# Patient Record
Sex: Female | Born: 1980 | Race: White | Hispanic: No | Marital: Married | Smoking: Never smoker
Health system: Southern US, Community
[De-identification: ages and names within clinical notes are randomized; demographics above are authoritative.]

## PROBLEM LIST (undated history)

## (undated) DIAGNOSIS — T753XXA Motion sickness, initial encounter: Secondary | ICD-10-CM

## (undated) DIAGNOSIS — T7840XA Allergy, unspecified, initial encounter: Secondary | ICD-10-CM

## (undated) DIAGNOSIS — Z8489 Family history of other specified conditions: Secondary | ICD-10-CM

## (undated) DIAGNOSIS — C44311 Basal cell carcinoma of skin of nose: Secondary | ICD-10-CM

## (undated) DIAGNOSIS — C44319 Basal cell carcinoma of skin of other parts of face: Secondary | ICD-10-CM

## (undated) HISTORY — PX: LEEP: SHX91

## (undated) HISTORY — DX: Basal cell carcinoma of skin of other parts of face: C44.319

## (undated) HISTORY — DX: Basal cell carcinoma of skin of nose: C44.311

## (undated) HISTORY — DX: Allergy, unspecified, initial encounter: T78.40XA

## (undated) HISTORY — PX: WISDOM TOOTH EXTRACTION: SHX21

---

## 2004-12-22 ENCOUNTER — Inpatient Hospital Stay: Payer: Self-pay | Admitting: Obstetrics and Gynecology

## 2007-03-06 ENCOUNTER — Emergency Department: Payer: Self-pay | Admitting: Emergency Medicine

## 2008-06-16 ENCOUNTER — Ambulatory Visit: Payer: Self-pay | Admitting: Internal Medicine

## 2010-06-21 ENCOUNTER — Ambulatory Visit: Payer: Self-pay | Admitting: Internal Medicine

## 2013-05-17 DIAGNOSIS — Z85828 Personal history of other malignant neoplasm of skin: Secondary | ICD-10-CM

## 2013-05-17 HISTORY — DX: Personal history of other malignant neoplasm of skin: Z85.828

## 2014-01-16 DIAGNOSIS — Z86018 Personal history of other benign neoplasm: Secondary | ICD-10-CM

## 2014-01-16 HISTORY — DX: Personal history of other benign neoplasm: Z86.018

## 2016-01-15 ENCOUNTER — Other Ambulatory Visit: Payer: Self-pay | Admitting: Obstetrics and Gynecology

## 2016-01-15 DIAGNOSIS — Z1231 Encounter for screening mammogram for malignant neoplasm of breast: Secondary | ICD-10-CM

## 2016-02-12 ENCOUNTER — Ambulatory Visit: Payer: Self-pay

## 2016-02-14 ENCOUNTER — Ambulatory Visit
Admission: RE | Admit: 2016-02-14 | Discharge: 2016-02-14 | Disposition: A | Discharge status: Home / Self Care | Payer: Managed Care, Other (non HMO) | Source: Ambulatory Visit | Attending: Obstetrics and Gynecology | Admitting: Obstetrics and Gynecology

## 2016-02-14 ENCOUNTER — Encounter (HOSPITAL_COMMUNITY): Payer: Self-pay

## 2016-02-14 DIAGNOSIS — Z1231 Encounter for screening mammogram for malignant neoplasm of breast: Secondary | ICD-10-CM | POA: Diagnosis not present

## 2016-02-22 ENCOUNTER — Encounter: Payer: Self-pay | Admitting: *Deleted

## 2016-02-22 NOTE — Discharge Instructions (Signed)
[facility] °[facility] °ENDOSCOPIC SINUS SURGERY °[facility] ° °What is Functional Endoscopic Sinus Surgery? ° The Surgery involves making the natural openings of the sinuses larger by removing the bony partitions that separate the sinuses from the nasal cavity.  The natural sinus lining is preserved as much as possible to allow the sinuses to resume normal function after the surgery.  In some patients nasal polyps (excessively swollen lining of the sinuses) may be removed to relieve obstruction of the sinus openings.  The surgery is performed through the nose using lighted scopes, which eliminates the need for incisions on the face.  A septoplasty is a different procedure which is sometimes performed with sinus surgery.  It involves straightening the boy partition that separates the two sides of your nose.  A crooked or deviated septum may need repair if is obstructing the sinuses or nasal airflow.  Turbinate reduction is also often performed during sinus surgery.  The turbinates are bony proturberances from the side walls of the nose which swell and can obstruct the nose in patients with sinus and allergy problems.  Their size can be surgically reduced to help relieve nasal obstruction. ° °What Can Sinus Surgery Do For Me? ° Sinus surgery can reduce the frequency of sinus infections requiring antibiotic treatment.  This can provide improvement in nasal congestion, post-nasal drainage, facial pressure and nasal obstruction.  Surgery will NOT prevent you from ever having an infection again, so it usually only for patients who get infections 4 or more times yearly requiring antibiotics, or for infections that do not clear with antibiotics.  It will not cure nasal allergies, so patients with allergies may still require medication to treat their allergies after surgery. Surgery may improve headaches related to sinusitis, however, some people will continue to  require medication to control sinus headaches related to allergies.  Surgery will do nothing for other forms of headache (migraine, tension or cluster). ° °What Are the Risks of Endoscopic Sinus Surgery? ° Current techniques allow surgery to be performed safely with little risk, however, there are rare complications that patients should be aware of.  Because the sinuses are located around the eyes, there is risk of eye injury, including blindness, though again, this would be quite rare. This is usually a result of bleeding behind the eye during surgery, which puts the vision oat risk, though there are treatments to protect the vision and prevent permanent disrupted by surgery causing a leak of the spinal fluid that surrounds the brain.  More serious complications would include bleeding inside the brain cavity or damage to the brain.  Again, all of these complications are uncommon, and spinal fluid leaks can be safely managed surgically if they occur.  The most common complication of sinus surgery is bleeding from the nose, which may require packing or cauterization of the nose.  Continued sinus have polyps may experience recurrence of the polyps requiring revision surgery.  Alterations of sense of smell or injury to the tear ducts are also rare complications.  ° °What is the Surgery Like, and what is the Recovery? ° The Surgery usually takes a couple of hours to perform, and is usually performed under a general anesthetic (completely asleep).  Patients are usually discharged home after a couple of hours.  Sometimes during surgery it is necessary to pack the nose to control bleeding, and the packing is left in place for 24 - 48 hours, and removed by your surgeon.    If a septoplasty was performed during the procedure, there is often a splint placed which must be removed after 5-7 days.   °Discomfort: Pain is usually mild to moderate, and can be controlled by prescription pain medication or acetaminophen (Tylenol).   Aspirin, Ibuprofen (Advil, Motrin), or Naprosyn (Aleve) should be avoided, as they can cause increased bleeding.  Most patients feel sinus pressure like they have a bad head cold for several days.  Sleeping with your head elevated can help reduce swelling and facial pressure, as can ice packs over the face.  A humidifier may be helpful to keep the mucous and blood from drying in the nose.  ° °Diet: There are no specific diet restrictions, however, you should generally start with clear liquids and a light diet of bland foods because the anesthetic can cause some nausea.  Advance your diet depending on how your stomach feels.  Taking your pain medication with food will often help reduce stomach upset which pain medications can cause. ° °Nasal Saline Irrigation: It is important to remove blood clots and dried mucous from the nose as it is healing.  This is done by having you irrigate the nose at least 3 - 4 times daily with a salt water solution.  We recommend using NeilMed Sinus Rinse (available at the drug store).  Fill the squeeze bottle with the solution, bend over a sink, and insert the tip of the squeeze bottle into the nose ½ of an inch.  Point the tip of the squeeze bottle towards the inside corner of the eye on the same side your irrigating.  Squeeze the bottle and gently irrigate the nose.  If you bend forward as you do this, most of the fluid will flow back out of the nose, instead of down your throat.   The solution should be warm, near body temperature, when you irrigate.   Each time you irrigate, you should use a full squeeze bottle.  ° °Note that if you are instructed to use Nasal Steroid Sprays at any time after your surgery, irrigate with saline BEFORE using the steroid spray, so you do not wash it all out of the nose. °Another product, Nasal Saline Gel (such as AYR Nasal Saline Gel) can be applied in each nostril 3 - 4 times daily to moisture the nose and reduce scabbing or crusting. ° °Bleeding:   Bloody drainage from the nose can be expected for several days, and patients are instructed to irrigate their nose frequently with salt water to help remove mucous and blood clots.  The drainage may be dark red or brown, though some fresh blood may be seen intermittently, especially after irrigation.  Do not blow you nose, as bleeding may occur. If you must sneeze, keep your mouth open to allow air to escape through your mouth. ° °If heavy bleeding occurs: Irrigate the nose with saline to rinse out clots, then spray the nose 3 - 4 times with Afrin Nasal Decongestant Spray.  The spray will constrict the blood vessels to slow bleeding.  Pinch the lower half of your nose shut to apply pressure, and lay down with your head elevated.  Ice packs over the nose may help as well. If bleeding persists despite these measures, you should notify your doctor.  Do not use the Afrin routinely to control nasal congestion after surgery, as it can result in worsening congestion and may affect healing.  ° °Activity: Return to work varies among patients. Most patients will be out of   work at least 5 - 7 days to recover.  Patient may return to work after they are off of narcotic pain medication, and feeling well enough to perform the functions of their job.  Patients must avoid heavy lifting (over 10 pounds) or strenuous physical for 2 weeks after surgery, so your employer may need to assign you to light duty, or keep you out of work longer if light duty is not possible.  NOTE: you should not drive, operate dangerous machinery, do any mentally demanding tasks or make any important legal or financial decisions while on narcotic pain medication and recovering from the general anesthetic.  °  °Call Your Doctor Immediately if You Have Any of the Following: °1. Bleeding that you cannot control with the above measures °2. Loss of vision, double vision, bulging of the eye or black eyes. °3. Fever over 101 degrees °4. Neck stiffness with severe  headache, fever, nausea and change in mental state. °You are always encourage to call anytime with concerns, however, please call with requests for pain medication refills during office hours. ° °Office Endoscopy: During follow-up visits your doctor will remove any packing or splints that may have been placed and evaluate and clean your sinuses endoscopically.  Topical anesthetic will be used to make this as comfortable as possible, though you may want to take your pain medication prior to the visit.  How often this will need to be done varies from patient to patient.  After complete recovery from the surgery, you may need follow-up endoscopy from time to time, particularly if there is concern of recurrent infection or nasal polyps. ° ° °General Anesthesia, Adult, Care After °These instructions provide you with information about caring for yourself after your procedure. Your health care provider may also give you more specific instructions. Your treatment has been planned according to current medical practices, but problems sometimes occur. Call your health care provider if you have any problems or questions after your procedure. °What can I expect after the procedure? °After the procedure, it is common to have: °· Vomiting. °· A sore throat. °· Mental slowness. °It is common to feel: °· Nauseous. °· Cold or shivery. °· Sleepy. °· Tired. °· Sore or achy, even in parts of your body where you did not have surgery. °Follow these instructions at home: °For at least 24 hours after the procedure: °· Do not: °¨ Participate in activities where you could fall or become injured. °¨ Drive. °¨ Use heavy machinery. °¨ Drink alcohol. °¨ Take sleeping pills or medicines that cause drowsiness. °¨ Make important decisions or sign legal documents. °¨ Take care of children on your own. °· Rest. °Eating and drinking °· If you vomit, drink water, juice, or soup when you can drink without vomiting. °· Drink enough fluid to keep your  urine clear or pale yellow. °· Make sure you have little or no nausea before eating solid foods. °· Follow the diet recommended by your health care provider. °General instructions °· Have a responsible adult stay with you until you are awake and alert. °· Return to your normal activities as told by your health care provider. Ask your health care provider what activities are safe for you. °· Take over-the-counter and prescription medicines only as told by your health care provider. °· If you smoke, do not smoke without supervision. °· Keep all follow-up visits as told by your health care provider. This is important. °Contact a health care provider if: °· You continue to have nausea   or vomiting at home, and medicines are not helpful. °· You cannot drink fluids or start eating again. °· You cannot urinate after 8-12 hours. °· You develop a skin rash. °· You have fever. °· You have increasing redness at the site of your procedure. °Get help right away if: °· You have difficulty breathing. °· You have chest pain. °· You have unexpected bleeding. °· You feel that you are having a life-threatening or urgent problem. °This information is not intended to replace advice given to you by your health care provider. Make sure you discuss any questions you have with your health care provider. °Document Released: 06/30/2000 Document Revised: 08/27/2015 Document Reviewed: 03/08/2015 °Elsevier Interactive Patient Education © 2017 [employer] ° °

## 2016-02-29 ENCOUNTER — Encounter
Admission: RE | Disposition: A | Discharge status: Home / Self Care | Payer: Self-pay | Source: Ambulatory Visit | Attending: Unknown Physician Specialty

## 2016-02-29 ENCOUNTER — Ambulatory Visit: Payer: Managed Care, Other (non HMO) | Admitting: Anesthesiology

## 2016-02-29 ENCOUNTER — Ambulatory Visit
Admission: RE | Admit: 2016-02-29 | Discharge: 2016-02-29 | Disposition: A | Discharge status: Home / Self Care | Payer: Managed Care, Other (non HMO) | Source: Ambulatory Visit | Attending: Unknown Physician Specialty | Admitting: Unknown Physician Specialty

## 2016-02-29 DIAGNOSIS — J343 Hypertrophy of nasal turbinates: Secondary | ICD-10-CM | POA: Insufficient documentation

## 2016-02-29 DIAGNOSIS — Z87891 Personal history of nicotine dependence: Secondary | ICD-10-CM | POA: Insufficient documentation

## 2016-02-29 DIAGNOSIS — J3489 Other specified disorders of nose and nasal sinuses: Secondary | ICD-10-CM | POA: Diagnosis not present

## 2016-02-29 HISTORY — PX: SEPTOPLASTY: SHX2393

## 2016-02-29 HISTORY — PX: NASAL TURBINATE REDUCTION: SHX2072

## 2016-02-29 HISTORY — DX: Family history of other specified conditions: Z84.89

## 2016-02-29 HISTORY — DX: Motion sickness, initial encounter: T75.3XXA

## 2016-02-29 SURGERY — SEPTOPLASTY, NOSE
Anesthesia: General | Site: Nose | Wound class: Clean Contaminated

## 2016-02-29 MED ORDER — LIDOCAINE-EPINEPHRINE 1 %-1:100000 IJ SOLN
INTRAMUSCULAR | Status: DC | PRN
  Administered 2016-02-29: 12 mL

## 2016-02-29 MED ORDER — ONDANSETRON HCL 4 MG/2ML IJ SOLN: 4.0000 mg | Freq: Once | INTRAMUSCULAR | Status: DC | PRN

## 2016-02-29 MED ORDER — LACTATED RINGERS IV SOLN
INTRAVENOUS | Status: DC
  Administered 2016-02-29: 10:00:00 via INTRAVENOUS

## 2016-02-29 MED ORDER — OXYCODONE-ACETAMINOPHEN 5-325 MG PO TABS: 1.0000 | ORAL_TABLET | ORAL | 0 refills | Status: DC | PRN

## 2016-02-29 MED ORDER — SULFAMETHOXAZOLE-TRIMETHOPRIM 800-160 MG PO TABS: 1.0000 | ORAL_TABLET | Freq: Two times a day (BID) | ORAL | 0 refills | Status: DC

## 2016-02-29 MED ORDER — FENTANYL CITRATE (PF) 100 MCG/2ML IJ SOLN
INTRAMUSCULAR | Status: DC | PRN
  Administered 2016-02-29: 100 ug via INTRAVENOUS

## 2016-02-29 MED ORDER — MIDAZOLAM HCL 5 MG/5ML IJ SOLN
INTRAMUSCULAR | Status: DC | PRN
  Administered 2016-02-29: 2 mg via INTRAVENOUS

## 2016-02-29 MED ORDER — PHENYLEPHRINE HCL 0.5 % NA SOLN
NASAL | Status: DC | PRN
  Administered 2016-02-29: 30 mL via TOPICAL

## 2016-02-29 MED ORDER — LIDOCAINE HCL (CARDIAC) 20 MG/ML IV SOLN
INTRAVENOUS | Status: DC | PRN
Start: 2016-02-29 — End: 2016-02-29
  Administered 2016-02-29: 40 mg via INTRAVENOUS

## 2016-02-29 MED ORDER — BACITRACIN 500 UNIT/GM EX OINT
TOPICAL_OINTMENT | CUTANEOUS | Status: DC | PRN
  Administered 2016-02-29: 1 via TOPICAL

## 2016-02-29 MED ORDER — GLYCOPYRROLATE 0.2 MG/ML IJ SOLN
INTRAMUSCULAR | Status: DC | PRN
  Administered 2016-02-29: 0.1 mg via INTRAVENOUS

## 2016-02-29 MED ORDER — OXYCODONE HCL 5 MG/5ML PO SOLN: 5.0000 mg | Freq: Once | ORAL | Status: AC | PRN

## 2016-02-29 MED ORDER — SUCCINYLCHOLINE CHLORIDE 20 MG/ML IJ SOLN
INTRAMUSCULAR | Status: DC | PRN
Start: 2016-02-29 — End: 2016-02-29
  Administered 2016-02-29: 100 mg via INTRAVENOUS

## 2016-02-29 MED ORDER — SCOPOLAMINE 1 MG/3DAYS TD PT72
1.0000 | MEDICATED_PATCH | Freq: Once | TRANSDERMAL | Status: DC
  Administered 2016-02-29: 1.5 mg via TRANSDERMAL

## 2016-02-29 MED ORDER — DEXAMETHASONE SODIUM PHOSPHATE 4 MG/ML IJ SOLN
INTRAMUSCULAR | Status: DC | PRN
Start: 2016-02-29 — End: 2016-02-29
  Administered 2016-02-29: 10 mg via INTRAVENOUS

## 2016-02-29 MED ORDER — PROPOFOL 10 MG/ML IV BOLUS
INTRAVENOUS | Status: DC | PRN
  Administered 2016-02-29: 150 mg via INTRAVENOUS

## 2016-02-29 MED ORDER — FENTANYL CITRATE (PF) 100 MCG/2ML IJ SOLN: 25.0000 ug | INTRAMUSCULAR | Status: DC | PRN

## 2016-02-29 MED ORDER — ACETAMINOPHEN 10 MG/ML IV SOLN
1000.0000 mg | Freq: Once | INTRAVENOUS | Status: AC
  Administered 2016-02-29: 1000 mg via INTRAVENOUS

## 2016-02-29 MED ORDER — ONDANSETRON HCL 4 MG/2ML IJ SOLN
INTRAMUSCULAR | Status: DC | PRN
  Administered 2016-02-29: 4 mg via INTRAVENOUS

## 2016-02-29 MED ORDER — OXYCODONE HCL 5 MG PO TABS
5.0000 mg | ORAL_TABLET | Freq: Once | ORAL | Status: AC | PRN
  Administered 2016-02-29: 5 mg via ORAL

## 2016-02-29 SURGICAL SUPPLY — 29 items
COAG SUCT 10F 3.5MM HAND CTRL (MISCELLANEOUS) ×3 IMPLANT
DRAPE HEAD BAR (DRAPES) ×3 IMPLANT
DRESSING NASL FOAM PST OP SINU (MISCELLANEOUS) ×4 IMPLANT
DRSG NASAL FOAM POST OP SINU (MISCELLANEOUS) ×6
GLOVE BIO SURGEON STRL SZ7.5 (GLOVE) ×9 IMPLANT
HANDLE YANKAUER SUCT BULB TIP (MISCELLANEOUS) ×3 IMPLANT
KIT ROOM TURNOVER OR (KITS) ×3 IMPLANT
NEEDLE HYPO 25GX1X1/2 BEV (NEEDLE) ×3 IMPLANT
NS IRRIG 500ML POUR BTL (IV SOLUTION) ×3 IMPLANT
PACK DRAPE NASAL/ENT (PACKS) ×3 IMPLANT
PAD GROUND ADULT SPLIT (MISCELLANEOUS) ×3 IMPLANT
SOL ANTI-FOG 6CC FOG-OUT (MISCELLANEOUS) ×2 IMPLANT
SOL FOG-OUT ANTI-FOG 6CC (MISCELLANEOUS) ×1
SPLINT NASAL SEPTAL BLV .25 LG (MISCELLANEOUS) IMPLANT
SPLINT NASAL SEPTAL BLV .50 ST (MISCELLANEOUS) ×3 IMPLANT
SPONGE NEURO XRAY DETECT 1X3 (DISPOSABLE) ×3 IMPLANT
STRAP BODY AND KNEE 60X3 (MISCELLANEOUS) ×3 IMPLANT
SUT CHROMIC 3-0 (SUTURE) ×1
SUT CHROMIC 3-0 KS 27XMFL CR (SUTURE) ×2
SUT CHROMIC 5-0 (SUTURE)
SUT CHROMIC 5-0 P2 18XMFL CR (SUTURE)
SUT ETHILON 3-0 KS 30 BLK (SUTURE) ×3 IMPLANT
SUT PLAIN GUT 4-0 (SUTURE) IMPLANT
SUTURE CHRMC 3-0 KS 27XMFL CR (SUTURE) ×2 IMPLANT
SUTURE CHRMC 5-0 P2 18XMF CR (SUTURE) IMPLANT
SYRINGE 10CC LL (SYRINGE) ×3 IMPLANT
TOWEL OR 17X26 4PK STRL BLUE (TOWEL DISPOSABLE) ×3 IMPLANT
WATER STERILE IRR 250ML POUR (IV SOLUTION) ×3 IMPLANT
WATER STERILE IRR 500ML POUR (IV SOLUTION) ×3 IMPLANT

## 2016-02-29 NOTE — H&P (Signed)
  H+P  Reviewed and will be scanned in later. No changes noted. 

## 2016-02-29 NOTE — Anesthesia Postprocedure Evaluation (Signed)
Anesthesia Post Note  Patient: Alexandra Gutierrez  Procedure(s) Performed: Procedure(s) (LRB): SEPTOPLASTY (N/A) TURBINATE REDUCTION/SUBMUCOSAL RESECTION (Bilateral)  Patient location during evaluation: PACU Anesthesia Type: General Level of consciousness: awake and alert and oriented Pain management: satisfactory to patient Vital Signs Assessment: post-procedure vital signs reviewed and stable Respiratory status: spontaneous breathing, nonlabored ventilation and respiratory function stable Cardiovascular status: blood pressure returned to baseline and stable Postop Assessment: Adequate PO intake and No signs of nausea or vomiting Anesthetic complications: no    Raliegh Ip

## 2016-02-29 NOTE — Op Note (Signed)
PREOPERATIVE DIAGNOSIS:  Chronic nasal obstruction.  POSTOPERATIVE DIAGNOSIS:  Chronic nasal obstruction.  SURGEON:  Roena Malady, M.D.  NAME OF PROCEDURE:  1. Nasal septoplasty. 2. Submucous resection of inferior turbinates.  OPERATIVE FINDINGS:  Severe nasal septal deformity, hypertrophy of the inferior turbinates.   DESCRIPTION OF THE PROCEDURE:  Alexandra Gutierrez was identified in the holding area and taken to the operating room and placed in the supine position.  After general endotracheal anesthesia was induced, the table was turned 45 degrees and the patient was placed in a semi-Fowler position.  The nose was then topically anesthetized with Lidocaine, cotton pledgets were placed within each nostril. After approximately 5 minutes, this was removed at which time a local anesthetic of 1% Lidocaine 1:100,000 units of Epinephrine was used to inject the inferior turbinates in the nasal septum. A total of 12 ml was used. Examination of the nose showed a severe right nasal septal deformity and tremendous hypertrophied inferior turbinate.  Beginning on the right hand side a hemitransfixion incision was then created on the leading edge of the septum on the right.  A subperichondrial plane was elevated posteriorly on the left and taken back to the perpendicular plate of the ethmoid where subperiosteal plane was elevated posteriorly on the left. A large septal spur was identified on the right hand side impacting on the inferior turbinate.  An inferior rim of cartilage was removed anteriorly with care taken to leave an anterior strut to prevent nasal collapse. With this strut removed the perpendicular plate of the ethmoid was separated from the quadrangular cartilage. The large septal spur was removed.  The septum was then replaced in the midline. Reinspection through each nostril showed excellent reduction of the septal deformity. A left posterior inferior fenestration was then created to allow hematoma  drainage.  With the septoplasty completed, beginning on the left-hand side, a 15 blade was used to incise along the inferior edge of the inferior turbinate. A superior laterally based flap was then elevated. The underlying conchal bone of mucosa was excised using Knight scissors. The flap was then laid back over the turbinate stump and cauterized using suction cautery. In a similar fashion the submucous resection was performed on the right.  With the submucous resection completed bilaterally and no active bleeding, the hemitransfixion incision was then closed using two interrupted 3-0 chromic sutures.  Plastic nasal septal splints were placed within each nostril and affixed to the septum using a 3-0 nylon suture. Stammberger was then used beneath each inferior turbinate for hemostasis.    The patient tolerated the procedure well, was returned to anesthesia, extubated in the operating room, and taken to the recovery room in stable condition.    CULTURES:  None.  SPECIMENS:  None.  ESTIMATED BLOOD LOSS:  25 cc.  [name] T  02/29/2016  12:24 PM

## 2016-02-29 NOTE — Transfer of Care (Signed)
Immediate Anesthesia Transfer of Care Note  Patient: Alexandra Gutierrez  Procedure(s) Performed: Procedure(s): SEPTOPLASTY (N/A) TURBINATE REDUCTION/SUBMUCOSAL RESECTION (Bilateral)  Patient Location: PACU  Anesthesia Type: General ETT  Level of Consciousness: awake, alert  and patient cooperative  Airway and Oxygen Therapy: Patient Spontanous Breathing and Patient connected to supplemental oxygen  Post-op Assessment: Post-op Vital signs reviewed, Patient's Cardiovascular Status Stable, Respiratory Function Stable, Patent Airway and No signs of Nausea or vomiting  Post-op Vital Signs: Reviewed and stable  Complications: No apparent anesthesia complications

## 2016-02-29 NOTE — Anesthesia Procedure Notes (Signed)
Procedure Name: Intubation Date/Time: 02/29/2016 11:39 AM Performed by: Mayme Genta Pre-anesthesia Checklist: Patient identified, Emergency Drugs available, Suction available, Patient being monitored and Timeout performed Patient Re-evaluated:Patient Re-evaluated prior to inductionOxygen Delivery Method: Circle system utilized Preoxygenation: Pre-oxygenation with 100% oxygen Intubation Type: IV induction Ventilation: Mask ventilation without difficulty Laryngoscope Size: Miller and 2 Grade View: Grade I Tube type: Oral Rae Tube size: 7.0 mm Number of attempts: 1 Placement Confirmation: ETT inserted through vocal cords under direct vision,  positive ETCO2 and breath sounds checked- equal and bilateral Tube secured with: Tape Dental Injury: Teeth and Oropharynx as per pre-operative assessment

## 2016-02-29 NOTE — Anesthesia Preprocedure Evaluation (Signed)
Anesthesia Evaluation  Patient identified by MRN, date of birth, ID band Patient awake    Reviewed: Allergy & Precautions, H&P , NPO status , Patient's Chart, lab work & pertinent test results  Airway Mallampati: II  TM Distance: >3 FB Neck ROM: full    Dental no notable dental hx.    Pulmonary former smoker,    Pulmonary exam normal        Cardiovascular Normal cardiovascular exam     Neuro/Psych    GI/Hepatic   Endo/Other    Renal/GU      Musculoskeletal   Abdominal   Peds  Hematology   Anesthesia Other Findings   Reproductive/Obstetrics                             Anesthesia Physical Anesthesia Plan  ASA: I  Anesthesia Plan: General ETT   Post-op Pain Management:    Induction:   Airway Management Planned:   Additional Equipment:   Intra-op Plan:   Post-operative Plan:   Informed Consent: I have reviewed the patients History and Physical, chart, labs and discussed the procedure including the risks, benefits and alternatives for the proposed anesthesia with the patient or authorized representative who has indicated his/her understanding and acceptance.     Plan Discussed with:   Anesthesia Plan Comments:         Anesthesia Quick Evaluation

## 2016-03-03 ENCOUNTER — Encounter: Payer: Self-pay | Admitting: Unknown Physician Specialty

## 2017-10-03 IMAGING — MG MM DIGITAL SCREENING BILAT W/ CAD
4 series · 4 of 4 positions shown · non-contrast
Comparison: None.

CLINICAL DATA: Screening.

EXAM:
DIGITAL SCREENING BILATERAL MAMMOGRAM WITH CAD

[L CC]
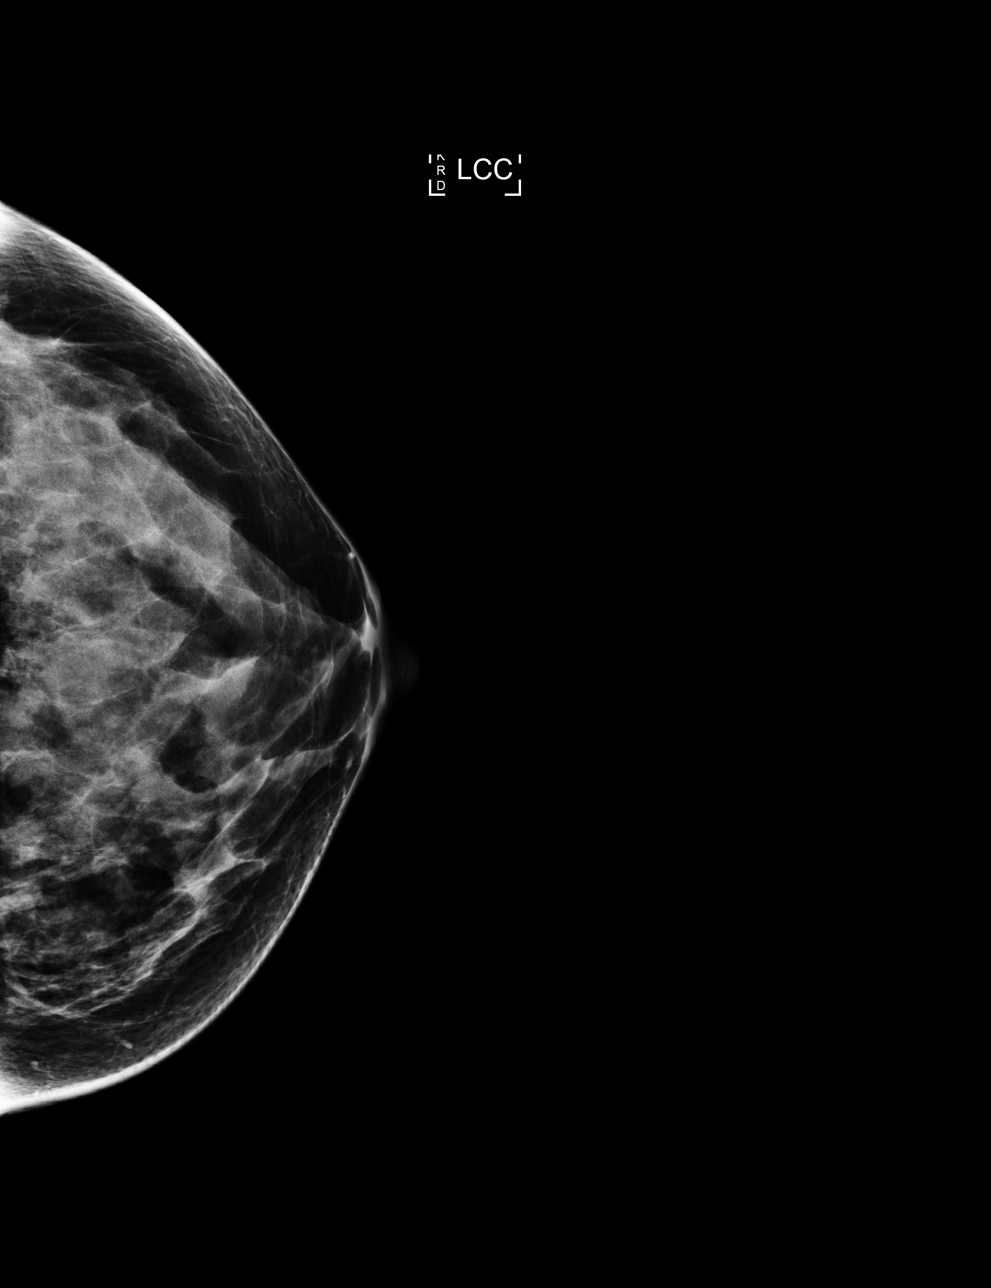

[R CC]
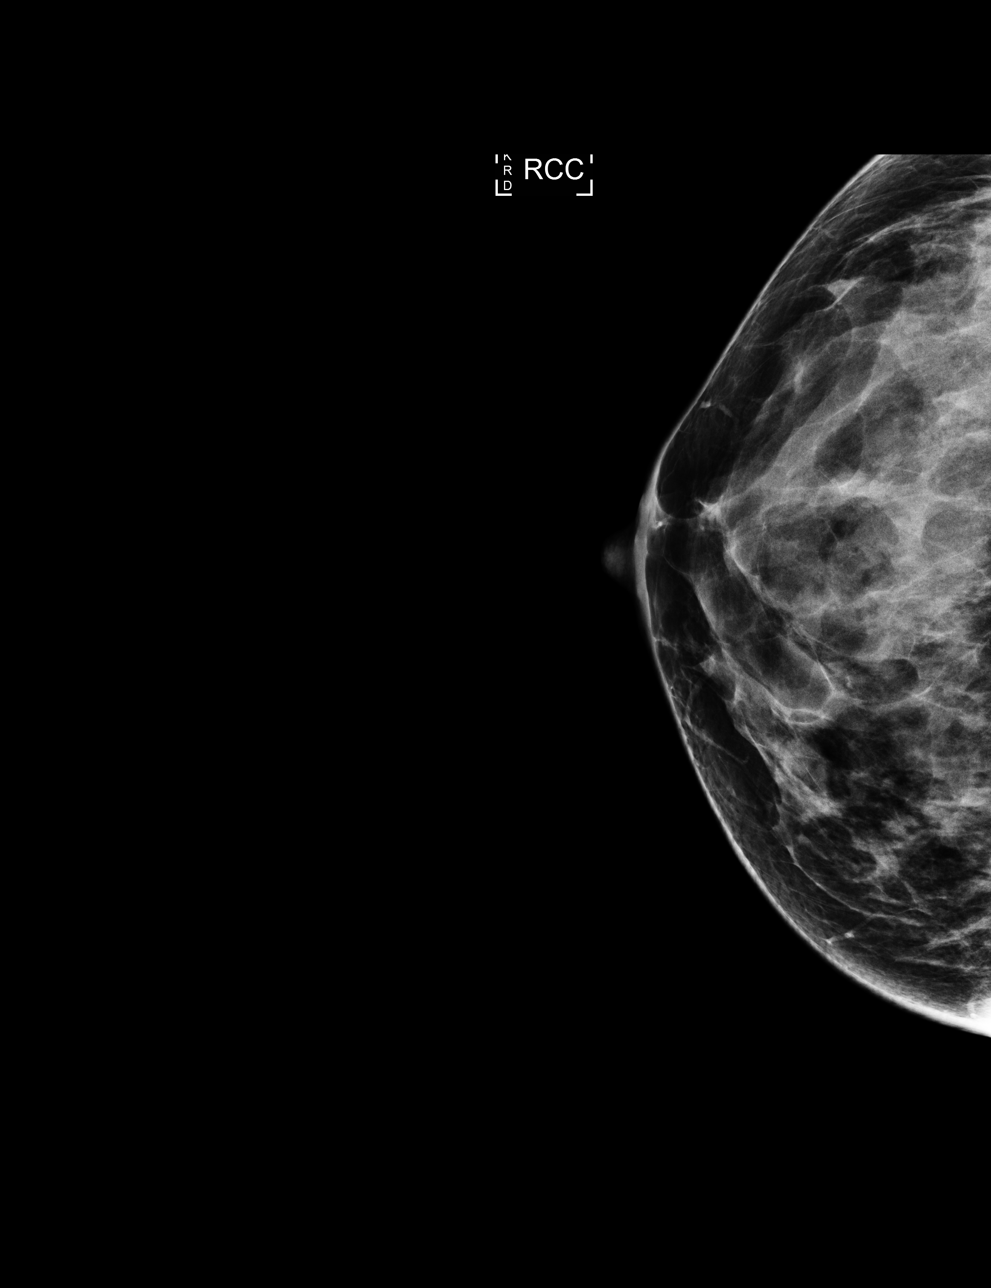

[R MLO]
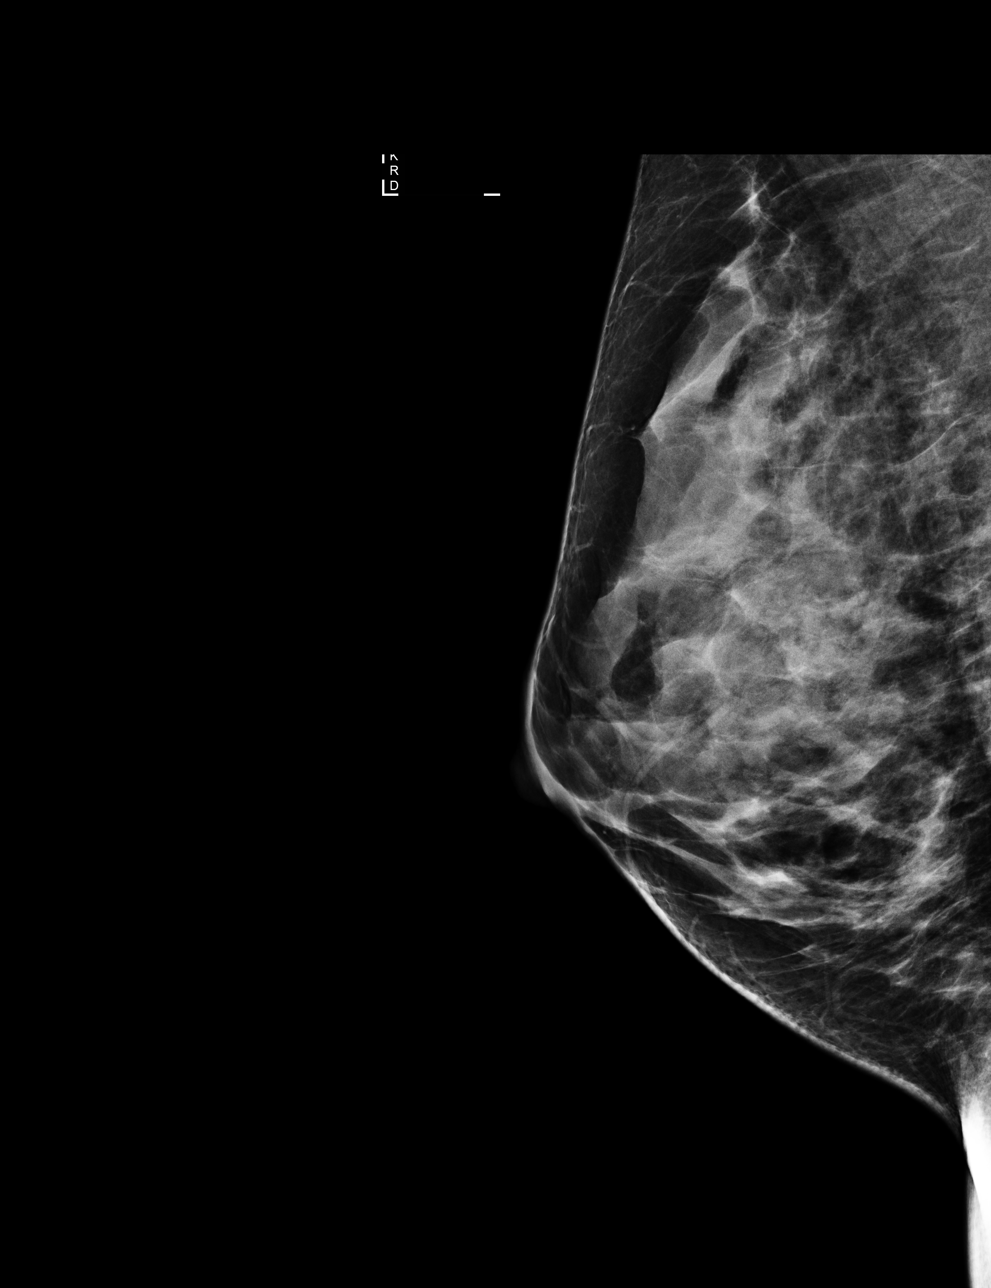

[L MLO]
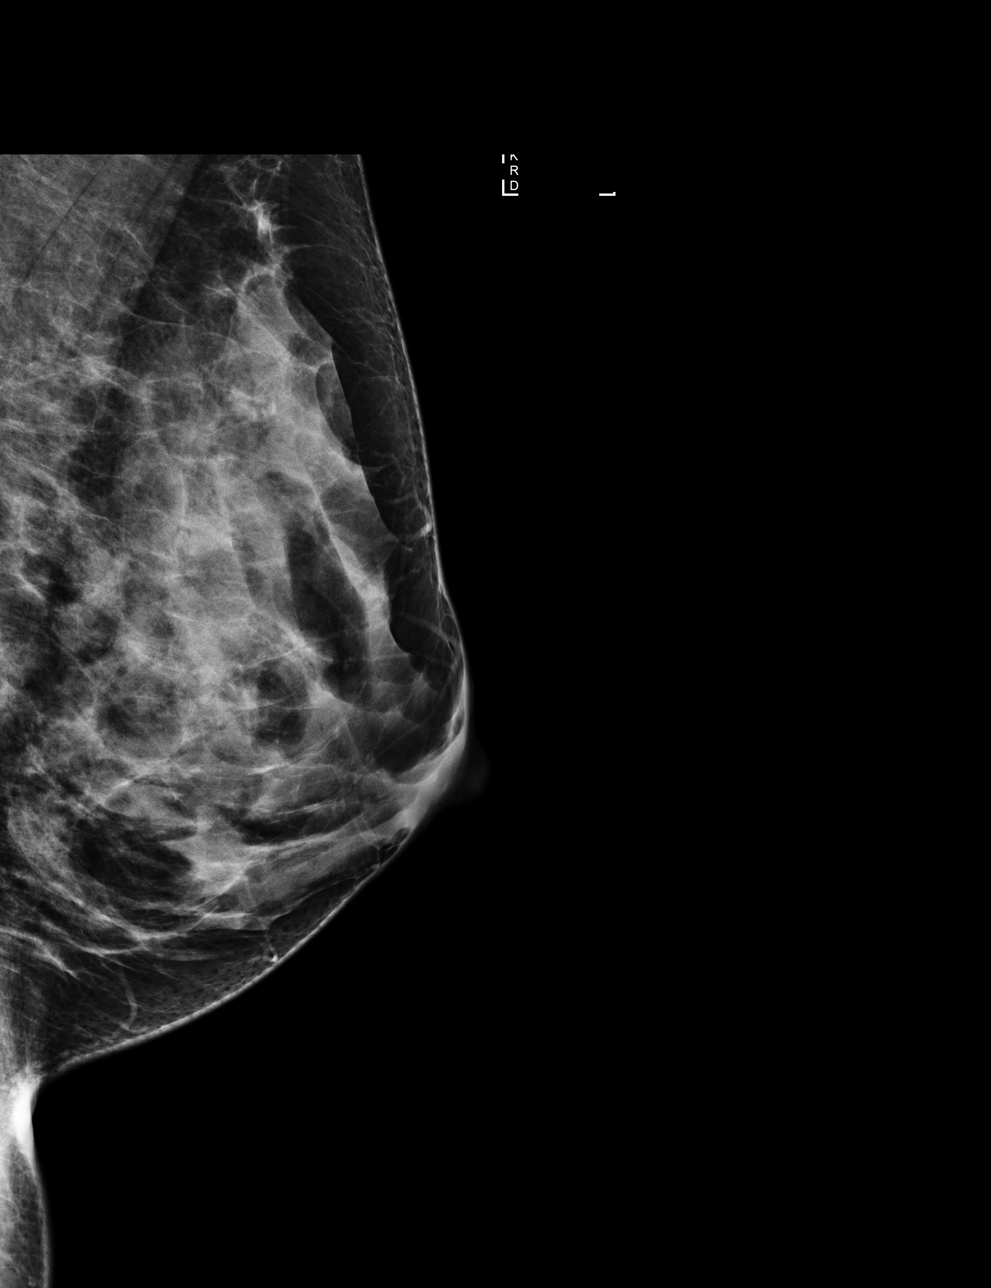

[4 of 4 positions shown; findings below may reference images not displayed]

ACR Breast Density Category c: The breast tissue is heterogeneously
dense, which may obscure small masses
FINDINGS: There are no findings suspicious for malignancy. Images were
processed with CAD.
IMPRESSION: No mammographic evidence of malignancy. A result letter of this
screening mammogram will be mailed directly to the patient.

RECOMMENDATION:
Screening mammogram at age 39. (Code:[id number])

BI-RADS CATEGORY  1: Negative.

## 2017-11-24 ENCOUNTER — Ambulatory Visit (INDEPENDENT_AMBULATORY_CARE_PROVIDER_SITE_OTHER): Payer: 59 | Admitting: Internal Medicine

## 2017-11-24 ENCOUNTER — Encounter: Payer: Self-pay | Admitting: Internal Medicine

## 2017-11-24 VITALS — BP 116/70 | HR 85 | Temp 98.3°F | Ht 66.5 in | Wt 145.0 lb

## 2017-11-24 DIAGNOSIS — Z9109 Other allergy status, other than to drugs and biological substances: Secondary | ICD-10-CM | POA: Diagnosis not present

## 2017-11-24 DIAGNOSIS — J069 Acute upper respiratory infection, unspecified: Secondary | ICD-10-CM

## 2017-11-24 MED ORDER — AZITHROMYCIN 250 MG PO TABS: ORAL_TABLET | ORAL | 0 refills | Status: DC

## 2017-11-24 MED ORDER — HYDROCODONE-HOMATROPINE 5-1.5 MG/5ML PO SYRP: 5.0000 mL | ORAL_SOLUTION | Freq: Three times a day (TID) | ORAL | 0 refills | Status: DC | PRN

## 2017-11-24 NOTE — Patient Instructions (Signed)
Upper Respiratory Infection, Adult Most upper respiratory infections (URIs) are caused by a virus. A URI affects the nose, throat, and upper air passages. The most common type of URI is often called "the common cold." Follow these instructions at home:  Take medicines only as told by your doctor.  Gargle warm saltwater or take cough drops to comfort your throat as told by your doctor.  Use a warm mist humidifier or inhale steam from a shower to increase air moisture. This may make it easier to breathe.  Drink enough fluid to keep your pee (urine) clear or pale yellow.  Eat soups and other clear broths.  Have a healthy diet.  Rest as needed.  Go back to work when your fever is gone or your doctor says it is okay. ? You may need to stay home longer to avoid giving your URI to others. ? You can also wear a face mask and wash your hands often to prevent spread of the virus.  Use your inhaler more if you have asthma.  Do not use any tobacco products, including cigarettes, chewing tobacco, or electronic cigarettes. If you need help quitting, ask your doctor. Contact a doctor if:  You are getting worse, not better.  Your symptoms are not helped by medicine.  You have chills.  You are getting more short of breath.  You have brown or red mucus.  You have yellow or brown discharge from your nose.  You have pain in your face, especially when you bend forward.  You have a fever.  You have puffy (swollen) neck glands.  You have pain while swallowing.  You have white areas in the back of your throat. Get help right away if:  You have very bad or constant: ? Headache. ? Ear pain. ? Pain in your forehead, behind your eyes, and over your cheekbones (sinus pain). ? Chest pain.  You have long-lasting (chronic) lung disease and any of the following: ? Wheezing. ? Long-lasting cough. ? Coughing up blood. ? A change in your usual mucus.  You have a stiff neck.  You have  changes in your: ? Vision. ? Hearing. ? Thinking. ? Mood. This information is not intended to replace advice given to you by your health care provider. Make sure you discuss any questions you have with your health care provider. Document Released: 09/10/2007 Document Revised: 11/25/2015 Document Reviewed: 06/29/2013 Elsevier Interactive Patient Education  2018 [employer].  

## 2017-11-24 NOTE — Assessment & Plan Note (Signed)
Continue to alternate Claritin and Zyrtec Add in [facility]

## 2017-11-24 NOTE — Progress Notes (Signed)
HPI  Pt presents to the clinic today to establish care and for management of the conditions listed below. She has not had a PCP in many years.  Allergies: Occur all year long. She alternates Claritin and Zyrtec with good relief.  She also reports itchy ears, sore throat, cough and chest congestion. This started 4 days ago. She denies difficulty swallowing. The cough is productive of green mucous. She has run fevers but denies chills or body aches. She has tried Claritin without any relief. She has not had sick contacts. She recently travelled to the beach.  Flu: 01/2017 Tetanus: > 10 years ago Pap Smear: 01/2017 Dentist: biannually  Past Medical History:  Diagnosis Date  . Allergy   . Basal cell carcinoma (BCC) of nasal crease   . Family history of adverse reaction to anesthesia    Mother - PONV  . Motion sickness    back seat of cars    Current Outpatient Medications  Medication Sig Dispense Refill  . cetirizine (ZYRTEC) 10 MG tablet Take 10 mg by mouth daily.    Marland Kitchen loratadine (CLARITIN) 10 MG tablet Take 10 mg by mouth daily.    . Norgestimate-Ethinyl Estradiol Triphasic (TRINESSA, 28,) 0.18/0.215/0.25 MG-35 MCG tablet TAKE 1 TABLET BY MOUTH EVERY DAY    . azithromycin (ZITHROMAX) 250 MG tablet Take 2 tabs today, then 1 tab daily x 4 days 6 tablet 0  . HYDROcodone-homatropine (HYCODAN) 5-1.5 MG/5ML syrup Take 5 mLs by mouth every 8 (eight) hours as needed for cough. 120 mL 0   No current facility-administered medications for this visit.     No Known Allergies  Family History  Problem Relation Age of Onset  . Arthritis Mother   . Hearing loss Mother   . Arthritis Maternal Grandmother   . Asthma Maternal Grandmother   . COPD Maternal Grandmother     Social History   Socioeconomic History  . Marital status: Married    Spouse name: Not on file  . Number of children: Not on file  . Years of education: Not on file  . Highest education level: Not on file  Occupational  History  . Not on file  Social Needs  . Financial resource strain: Not on file  . Food insecurity:    Worry: Not on file    Inability: Not on file  . Transportation needs:    Medical: Not on file    Non-medical: Not on file  Tobacco Use  . Smoking status: Never Smoker  . Smokeless tobacco: Never Used  Substance and Sexual Activity  . Alcohol use: Yes    Alcohol/week: 4.0 standard drinks    Types: 2 Glasses of wine, 1 Cans of beer, 1 Shots of liquor per week    Comment: occasional  . Drug use: Not on file  . Sexual activity: Not on file  Lifestyle  . Physical activity:    Days per week: Not on file    Minutes per session: Not on file  . Stress: Not on file  Relationships  . Social connections:    Talks on phone: Not on file    Gets together: Not on file    Attends religious service: Not on file    Active member of club or organization: Not on file    Attends meetings of clubs or organizations: Not on file    Relationship status: Not on file  . Intimate partner violence:    Fear of current or ex partner: Not on file  Emotionally abused: Not on file    Physically abused: Not on file    Forced sexual activity: Not on file  Other Topics Concern  . Not on file  Social History Narrative  . Not on file    ROS:  Constitutional: Pt reports fever. Denies malaise, fatigue, headache or abrupt weight changes.  HEENT: Pt reports itchy ears, sore throat. Denies eye pain, eye redness, ear pain, ringing in the ears, wax buildup, runny nose, nasal congestion, bloody nose. Respiratory: Pt reports cough. Denies difficulty breathing, shortness of breath.   Cardiovascular: Denies chest pain, chest tightness, palpitations or swelling in the hands or feet.  Gastrointestinal: Denies abdominal pain, bloating, constipation, diarrhea or blood in the stool.  GU: Denies frequency, urgency, pain with urination, blood in urine, odor or discharge. Musculoskeletal: Denies decrease in range of  motion, difficulty with gait, muscle pain or joint pain and swelling.  Skin: Denies redness, rashes, lesions or ulcercations.  Neurological: Denies dizziness, difficulty with memory, difficulty with speech or problems with balance and coordination.  Psych: Denies anxiety, depression, SI/HI.  No other specific complaints in a complete review of systems (except as listed in HPI above).  PE:  BP 116/70   Pulse 85   Temp 98.3 F (36.8 C) (Oral)   Ht 5' 6.5" (1.689 m)   Wt 145 lb (65.8 kg)   LMP 11/22/2017   SpO2 98%   BMI 23.05 kg/m  Wt Readings from Last 3 Encounters:  11/24/17 145 lb (65.8 kg)  02/29/16 149 lb (67.6 kg)    General: Appears her stated age, well developed, well nourished in NAD. HEENT: Head: normal shape and size, no sinus tenderness noted; Ears: Tm's gray and intact, normal light reflex; Throat/Mouth: Teeth present, mucosa erythematous and moist, + PND, no lesions or ulcerations noted.  Neck: Bilateral anterior cervical adenopathy noted. Cardiovascular: Normal rate and rhythm. S1,S2 noted.  No murmur, rubs or gallops noted. Pulmonary/Chest: Normal effort and positive vesicular breath sounds. No respiratory distress. No wheezes, rales or ronchi noted.  Neurological: Alert and oriented.     Assessment and Plan:  Acute Upper Respiratory Infection with Cough:  Get some rest and drink plenty of water Salt water gargles as needed for sore throat Continue Claritin, add in Flonase OTC eRx for Azithromax x 5 days eRx for Hycodan as needed for cough  Make an appt for your annual exam Webb Silversmith, NP

## 2019-03-16 DIAGNOSIS — D239 Other benign neoplasm of skin, unspecified: Secondary | ICD-10-CM

## 2019-03-16 HISTORY — DX: Other benign neoplasm of skin, unspecified: D23.9

## 2019-04-04 ENCOUNTER — Other Ambulatory Visit: Payer: Self-pay

## 2019-04-04 ENCOUNTER — Ambulatory Visit: Payer: BC Managed Care – PPO | Attending: Internal Medicine

## 2019-04-04 DIAGNOSIS — Z20822 Contact with and (suspected) exposure to covid-19: Secondary | ICD-10-CM

## 2019-04-06 LAB — NOVEL CORONAVIRUS, NAA: SARS-CoV-2, NAA: NOT DETECTED

## 2019-07-24 ENCOUNTER — Other Ambulatory Visit: Payer: Self-pay

## 2019-07-24 ENCOUNTER — Ambulatory Visit
Admission: EM | Admit: 2019-07-24 | Discharge: 2019-07-24 | Disposition: A | Discharge status: Home / Self Care | Payer: BC Managed Care – PPO | Attending: Family Medicine | Admitting: Family Medicine

## 2019-07-24 ENCOUNTER — Encounter: Payer: Self-pay | Admitting: Emergency Medicine

## 2019-07-24 DIAGNOSIS — R5383 Other fatigue: Secondary | ICD-10-CM | POA: Insufficient documentation

## 2019-07-24 DIAGNOSIS — Z20822 Contact with and (suspected) exposure to covid-19: Secondary | ICD-10-CM | POA: Diagnosis not present

## 2019-07-24 DIAGNOSIS — R519 Headache, unspecified: Secondary | ICD-10-CM | POA: Diagnosis not present

## 2019-07-24 DIAGNOSIS — H9209 Otalgia, unspecified ear: Secondary | ICD-10-CM | POA: Insufficient documentation

## 2019-07-24 DIAGNOSIS — R110 Nausea: Secondary | ICD-10-CM | POA: Insufficient documentation

## 2019-07-24 DIAGNOSIS — Z79899 Other long term (current) drug therapy: Secondary | ICD-10-CM | POA: Insufficient documentation

## 2019-07-24 DIAGNOSIS — R509 Fever, unspecified: Secondary | ICD-10-CM

## 2019-07-24 DIAGNOSIS — R109 Unspecified abdominal pain: Secondary | ICD-10-CM | POA: Insufficient documentation

## 2019-07-24 LAB — URINALYSIS, COMPLETE (UACMP) WITH MICROSCOPIC
Bilirubin Urine: NEGATIVE
Glucose, UA: NEGATIVE mg/dL
Ketones, ur: NEGATIVE mg/dL
Nitrite: NEGATIVE
Protein, ur: 100 mg/dL — AB
Specific Gravity, Urine: 1.02 (ref 1.005–1.030)
pH: 7 (ref 5.0–8.0)

## 2019-07-24 NOTE — ED Provider Notes (Signed)
[facility]    CSN: [id number] Arrival date & time: 07/24/19  1023      History   Chief Complaint Chief Complaint  Patient presents with  . Generalized Body Aches  . Fatigue   HPI  39 year old female presents for evaluation regarding ongoing respiratory symptoms and fatigue.  Patient reports that she has not been feeling well since Tuesday.  She reports headache, fogginess, ear pain, chills.  She has had ongoing cough and runny nose as well.  She reports significant fatigue.  She reports that she developed a fever later in the week (T-max 100.5).  Patient has also had some abdominal pain and nausea.  She has had some urinary urgency as well.  She has been taking Tylenol and ibuprofen for fever.  She has also taken an old prescription of amoxicillin since Tuesday.  She has taken it 4 times a day.  She continues to not feel well.    Past Medical History:  Diagnosis Date  . Allergy   . Basal cell carcinoma (BCC) of nasal crease   . Family history of adverse reaction to anesthesia    Mother - PONV  . Hx of basal cell carcinoma 05/17/2013   right lateral mid nose   . Hx of dysplastic nevus 01/16/2014   right upper back spinal   . Motion sickness    back seat of cars    Patient Active Problem List   Diagnosis Date Noted  . Environmental allergies 11/24/2017    Past Surgical History:  Procedure Laterality Date  . LEEP    . NASAL TURBINATE REDUCTION Bilateral 02/29/2016   Procedure: TURBINATE REDUCTION/SUBMUCOSAL RESECTION;  Surgeon: Beverly Gust, MD;  Location: [place];  Service: ENT;  Laterality: Bilateral;  . SEPTOPLASTY N/A 02/29/2016   Procedure: SEPTOPLASTY;  Surgeon: Beverly Gust, MD;  Location: [place];  Service: ENT;  Laterality: N/A;  . WISDOM TOOTH EXTRACTION      OB History   No obstetric history on file.      Home Medications    Prior to Admission medications   Medication Sig Start Date End Date Taking?  Authorizing Provider  cetirizine (ZYRTEC) 10 MG tablet Take 10 mg by mouth daily.   Yes [provider]  loratadine (CLARITIN) 10 MG tablet Take 10 mg by mouth daily.   Yes [provider]  Norgestimate-Ethinyl Estradiol Triphasic (TRINESSA, 28,) 0.18/0.215/0.25 MG-35 MCG tablet TAKE 1 TABLET BY MOUTH EVERY DAY 02/22/17  Yes [provider]    Family History Family History  Problem Relation Age of Onset  . Arthritis Mother   . Hearing loss Mother   . Arthritis Maternal Grandmother   . Asthma Maternal Grandmother   . COPD Maternal Grandmother     Social History Social History   Tobacco Use  . Smoking status: Never Smoker  . Smokeless tobacco: Never Used  Substance Use Topics  . Alcohol use: Yes    Alcohol/week: 4.0 standard drinks    Types: 2 Glasses of wine, 1 Cans of beer, 1 Shots of liquor per week    Comment: occasional  . Drug use: Not on file     Allergies   Patient has no known allergies.   Review of Systems Review of Systems Per HPI  Physical Exam Triage Vital Signs ED Triage Vitals  Enc Vitals Group     BP 07/24/19 1037 115/82     Pulse Rate 07/24/19 1037 (!) 103     Resp 07/24/19 1037  14     Temp 07/24/19 1037 100 F (37.8 C)     Temp Source 07/24/19 1037 Oral     SpO2 07/24/19 1037 99 %     Weight 07/24/19 1033 140 lb (63.5 kg)     Height 07/24/19 1033 5\' 6"  (1.676 m)     Head Circumference --      Peak Flow --      Pain Score 07/24/19 1033 4     Pain Loc --      Pain Edu? --      Excl. in [place]? --    Updated Vital Signs BP 115/82 (BP Location: Right Arm)   Pulse (!) 103   Temp 100 F (37.8 C) (Oral)   Resp 14   Ht 5\' 6"  (1.676 m)   Wt 63.5 kg   LMP 07/10/2019 (Approximate)   SpO2 99%   BMI 22.60 kg/m   Visual Acuity Right Eye Distance:   Left Eye Distance:   Bilateral Distance:    Right Eye Near:   Left Eye Near:    Bilateral Near:     Physical Exam Vitals and nursing note reviewed.  Constitutional:       General: She is not in acute distress.    Appearance: Normal appearance. She is not ill-appearing.  HENT:     Head: Normocephalic and atraumatic.     Right Ear: Tympanic membrane normal.     Left Ear: Tympanic membrane normal.  Eyes:     General:        Right eye: No discharge.        Left eye: No discharge.     Conjunctiva/sclera: Conjunctivae normal.  Cardiovascular:     Rate and Rhythm: Regular rhythm. Tachycardia present.  Pulmonary:     Effort: Pulmonary effort is normal.     Breath sounds: Normal breath sounds. No wheezing, rhonchi or rales.  Abdominal:     General: There is no distension.     Palpations: Abdomen is soft.     Comments: Mild suprapubic tenderness to palpation.  Neurological:     Mental Status: She is alert.  Psychiatric:        Mood and Affect: Mood normal.        Behavior: Behavior normal.    UC Treatments / Results  Labs (all labs ordered are listed, but only abnormal results are displayed) Labs Reviewed  URINALYSIS, COMPLETE (UACMP) WITH MICROSCOPIC - Abnormal; Notable for the following components:      Result Value   APPearance HAZY (*)    Hgb urine dipstick SMALL (*)    Protein, ur 100 (*)    Leukocytes,Ua TRACE (*)    Bacteria, UA RARE (*)    All other components within normal limits  SARS CORONAVIRUS 2 (TAT 6-24 HRS)  URINE CULTURE    EKG   Radiology No results found.  Procedures Procedures (including critical care time)  Medications Ordered in UC Medications - No data to display  Initial Impression / Assessment and Plan / UC Course  I have reviewed the triage vital signs and the nursing notes.  Pertinent labs & imaging results that were available during my care of the patient were reviewed by me and considered in my medical decision making (see chart for details).    39 year old female presents with a febrile illness.  Suspect viral etiology.  Awaiting Covid test result.  Urinalysis not consistent with UTI.  Sending  culture.  Advised supportive care and continue rest,  fluids, Tylenol.  Follow-up with PCP.  Final Clinical Impressions(s) / UC Diagnoses   Final diagnoses:  Febrile illness     Discharge Instructions     Rest. Fluids.  Tylenol as needed.  If symptoms persist or worsen, please follow up with your PCP.   COVID test result should be available tomorrow.  Take care  Dr. Lacinda Axon    ED Prescriptions    None     PDMP not reviewed this encounter.   Coral Spikes, DO 07/24/19 1134

## 2019-07-24 NOTE — Discharge Instructions (Addendum)
Rest. Fluids.  Tylenol as needed.  If symptoms persist or worsen, please follow up with your PCP.   COVID test result should be available tomorrow.  Take care  Dr. Lacinda Axon

## 2019-07-24 NOTE — ED Triage Notes (Signed)
Patient c/o bodyaches and fatigue that started on Tuesday.  Patient states that that she was seen on 07/22/19 for a rapid COVID test and was Negative.  Patient states that she currently feels foggy and has had some urinary urgency and had a low grade fever yesterday.

## 2019-07-25 LAB — SARS CORONAVIRUS 2 (TAT 6-24 HRS): SARS Coronavirus 2: NEGATIVE

## 2019-07-26 LAB — URINE CULTURE: Culture: NO GROWTH

## 2019-11-21 ENCOUNTER — Ambulatory Visit
Admission: EM | Admit: 2019-11-21 | Discharge: 2019-11-21 | Disposition: A | Discharge status: Home / Self Care | Payer: BC Managed Care – PPO | Attending: Physician Assistant | Admitting: Physician Assistant

## 2019-11-21 ENCOUNTER — Ambulatory Visit: Payer: Self-pay

## 2019-11-21 ENCOUNTER — Other Ambulatory Visit: Payer: Self-pay

## 2019-11-21 DIAGNOSIS — Z20822 Contact with and (suspected) exposure to covid-19: Secondary | ICD-10-CM | POA: Diagnosis not present

## 2019-11-21 DIAGNOSIS — Z79899 Other long term (current) drug therapy: Secondary | ICD-10-CM | POA: Insufficient documentation

## 2019-11-21 DIAGNOSIS — R05 Cough: Secondary | ICD-10-CM | POA: Diagnosis not present

## 2019-11-21 DIAGNOSIS — R059 Cough, unspecified: Secondary | ICD-10-CM

## 2019-11-21 DIAGNOSIS — J069 Acute upper respiratory infection, unspecified: Secondary | ICD-10-CM | POA: Diagnosis not present

## 2019-11-21 LAB — SARS CORONAVIRUS 2 (TAT 6-24 HRS): SARS Coronavirus 2: NEGATIVE

## 2019-11-21 MED ORDER — AZITHROMYCIN 250 MG PO TABS: ORAL_TABLET | ORAL | 0 refills | Status: AC

## 2019-11-21 NOTE — ED Triage Notes (Signed)
Pt started Thursday with cough and felt like she had a cold. Now mucous in throat and coughing up green mucous and sometimes clear mucous

## 2019-11-21 NOTE — Discharge Instructions (Addendum)
-  azithromycin: Take 2 tablets by mouth the first day followed by one tablet daily for next 4 days. -Can continue over-the-counter medications for symptom management -Can add saline nasal rinse or Nettie pot like rinse to help clear out the sinuses -Push fluids -If not feeling better in a day or 2 or develop a fever, can start the azithromycin. -Follow-up primary care provider this clinic as needed should symptoms worsen or not improve

## 2019-11-21 NOTE — ED Provider Notes (Signed)
[facility]    CSN: [id number] Arrival date & time: 11/21/19  0957      History   Chief Complaint Chief Complaint  Patient presents with  . Cough    HPI Alexandra Gutierrez is a 39 y.o. female.   Patient is a 39 year old female who presents with chief complaint of cough sore throat and cold-like symptoms since Thursday.  Patient feels like her voice is worse this morning and that she is having some green mucus production but feels her congestion does bit better today.  Patient reports no fever.  Patient states she just got back from a week of vacation to [place].  She states she has been around some people who have letter been positive for Covid but has had no close personal prolonged contact.  Patient has had the Covid vaccine.  She has been taking over-the-counter medications of NyQuil, DayQuil, zinc, elderberry and vitamins since Thursday.  Patient denies any chest pain, shortness of breath, or any abdominal symptoms.  She does report some fatigue that she would get with a normal cold but denies any body aches.     Past Medical History:  Diagnosis Date  . Allergy   . Basal cell carcinoma (BCC) of nasal crease   . Family history of adverse reaction to anesthesia    Mother - PONV  . Hx of basal cell carcinoma 05/17/2013   right lateral mid nose   . Hx of dysplastic nevus 01/16/2014   right upper back spinal   . Motion sickness    back seat of cars    Patient Active Problem List   Diagnosis Date Noted  . Environmental allergies 11/24/2017    Past Surgical History:  Procedure Laterality Date  . LEEP    . NASAL TURBINATE REDUCTION Bilateral 02/29/2016   Procedure: TURBINATE REDUCTION/SUBMUCOSAL RESECTION;  Surgeon: Beverly Gust, MD;  Location: [place];  Service: ENT;  Laterality: Bilateral;  . SEPTOPLASTY N/A 02/29/2016   Procedure: SEPTOPLASTY;  Surgeon: Beverly Gust, MD;  Location: [place] [facility];  Service: ENT;  Laterality: N/A;    . WISDOM TOOTH EXTRACTION      OB History   No obstetric history on file.      Home Medications    Prior to Admission medications   Medication Sig Start Date End Date Taking? Authorizing Provider  azithromycin (ZITHROMAX Z-PAK) 250 MG tablet Take 2 tablets by mouth the first day followed by one tablet daily for next 4 days. 11/21/19   Luvenia Redden, PA-C  cetirizine (ZYRTEC) 10 MG tablet Take 10 mg by mouth daily.    [provider]  loratadine (CLARITIN) 10 MG tablet Take 10 mg by mouth daily.    [provider]  Norgestimate-Ethinyl Estradiol Triphasic (TRINESSA, 28,) 0.18/0.215/0.25 MG-35 MCG tablet TAKE 1 TABLET BY MOUTH EVERY DAY 02/22/17   [provider]    Family History Family History  Problem Relation Age of Onset  . Arthritis Mother   . Hearing loss Mother   . Arthritis Maternal Grandmother   . Asthma Maternal Grandmother   . COPD Maternal Grandmother     Social History Social History   Tobacco Use  . Smoking status: Never Smoker  . Smokeless tobacco: Never Used  Vaping Use  . Vaping Use: Never used  Substance Use Topics  . Alcohol use: Yes    Alcohol/week: 4.0 standard drinks    Types: 2 Glasses of wine, 1 Cans of beer, 1 Shots of liquor  per week    Comment: occasional  . Drug use: Never     Allergies   Patient has no known allergies.   Review of Systems Review of Systems as noted above HPI.  Other systems reviewed and found to be negative   Physical Exam Triage Vital Signs ED Triage Vitals  Enc Vitals Group     BP 11/21/19 1027 (!) 132/94     Pulse Rate 11/21/19 1027 89     Resp 11/21/19 1027 16     Temp 11/21/19 1027 98.2 F (36.8 C)     Temp Source 11/21/19 1027 Oral     SpO2 11/21/19 1027 100 %     Weight 11/21/19 1025 140 lb (63.5 kg)     Height 11/21/19 1025 5\' 6"  (1.676 m)     Head Circumference --      Peak Flow --      Pain Score 11/21/19 1025 0     Pain Loc --      Pain Edu? --      Excl. in  [place]? --    No data found.  Updated Vital Signs BP (!) 132/94 (BP Location: Right Arm)   Pulse 89   Temp 98.2 F (36.8 C) (Oral)   Resp 16   Ht 5\' 6"  (1.676 m)   Wt 140 lb (63.5 kg)   LMP 11/02/2019   SpO2 100%   BMI 22.60 kg/m   Physical Exam Constitutional:      Appearance: Normal appearance. She is obese. She is not ill-appearing.  HENT:     Right Ear: Tympanic membrane and ear canal normal.     Left Ear: Tympanic membrane and ear canal normal.     Nose:     Right Sinus: No maxillary sinus tenderness or frontal sinus tenderness.     Left Sinus: Maxillary sinus tenderness present. No frontal sinus tenderness.     Mouth/Throat:     Tonsils: 0 on the right. 0 on the left.     Comments: Clear postnasal drainage Cardiovascular:     Rate and Rhythm: Normal rate and regular rhythm.     Heart sounds: Normal heart sounds. No murmur heard.  No gallop.   Pulmonary:     Effort: Pulmonary effort is normal. No respiratory distress.     Breath sounds: Normal breath sounds. No wheezing or rales.     Comments: No wheezing with forced expiration Musculoskeletal:     Cervical back: Normal range of motion and neck supple.  Lymphadenopathy:     Cervical: No cervical adenopathy.  Skin:    General: Skin is warm and dry.     Capillary Refill: Capillary refill takes less than 2 seconds.  Neurological:     General: No focal deficit present.     Mental Status: She is alert and oriented to person, place, and time.  Psychiatric:        Mood and Affect: Mood normal.        Behavior: Behavior normal.      UC Treatments / Results  Labs (all labs ordered are listed, but only abnormal results are displayed) Labs Reviewed  SARS CORONAVIRUS 2 (TAT 6-24 HRS)    EKG   Radiology No results found.  Procedures Procedures (including critical care time)  Medications Ordered in UC Medications - No data to display  Initial Impression / Assessment and Plan / UC Course  I have reviewed  the triage vital signs and the nursing notes.  Pertinent  labs & imaging results that were available during my care of the patient were reviewed by me and considered in my medical decision making (see chart for details).    Patient with upper respiratory symptoms since this past Thursday.  Patient without any close contact exposure to Covid patients that she knows of.  Patient reports some symptoms little worse today but congestion is better.  Does report some intermittent green mucus production with cough and nose production.  Some tenderness over the left maxillary sinus.  We will have patient continue with her over-the-counter medications.  We will give her prescription for azithromycin should she have no improvement in her symptoms over the next day or so.  Have her push the fluids.  Follow-up with primary care provider this clinic as needed. Final Clinical Impressions(s) / UC Diagnoses   Final diagnoses:  Upper respiratory tract infection, unspecified type  Cough     Discharge Instructions     -azithromycin: Take 2 tablets by mouth the first day followed by one tablet daily for next 4 days. -Can continue over-the-counter medications for symptom management -Can add saline nasal rinse or Nettie pot like rinse to help clear out the sinuses -Push fluids -If not feeling better in a day or 2 or develop a fever, can start the azithromycin. -Follow-up primary care provider this clinic as needed should symptoms worsen or not improve    ED Prescriptions    Medication Sig Dispense Auth. Provider   azithromycin (ZITHROMAX Z-PAK) 250 MG tablet Take 2 tablets by mouth the first day followed by one tablet daily for next 4 days. 6 tablet Luvenia Redden, PA-C     PDMP not reviewed this encounter.   Luvenia Redden, PA-C 11/21/19 1213

## 2019-12-06 ENCOUNTER — Other Ambulatory Visit: Payer: Self-pay | Admitting: Obstetrics and Gynecology

## 2019-12-06 DIAGNOSIS — Z1231 Encounter for screening mammogram for malignant neoplasm of breast: Secondary | ICD-10-CM

## 2019-12-19 ENCOUNTER — Other Ambulatory Visit: Payer: Self-pay

## 2019-12-19 ENCOUNTER — Ambulatory Visit
Admission: RE | Admit: 2019-12-19 | Discharge: 2019-12-19 | Disposition: A | Discharge status: Home / Self Care | Payer: BC Managed Care – PPO | Source: Ambulatory Visit | Attending: Obstetrics and Gynecology | Admitting: Obstetrics and Gynecology

## 2019-12-19 DIAGNOSIS — Z1231 Encounter for screening mammogram for malignant neoplasm of breast: Secondary | ICD-10-CM | POA: Diagnosis present

## 2020-03-22 ENCOUNTER — Ambulatory Visit (INDEPENDENT_AMBULATORY_CARE_PROVIDER_SITE_OTHER): Payer: BC Managed Care – PPO | Admitting: Dermatology

## 2020-03-22 ENCOUNTER — Encounter: Payer: Self-pay | Admitting: Dermatology

## 2020-03-22 ENCOUNTER — Other Ambulatory Visit: Payer: Self-pay

## 2020-03-22 DIAGNOSIS — L309 Dermatitis, unspecified: Secondary | ICD-10-CM

## 2020-03-22 DIAGNOSIS — D1800 Hemangioma unspecified site: Secondary | ICD-10-CM

## 2020-03-22 DIAGNOSIS — Z86018 Personal history of other benign neoplasm: Secondary | ICD-10-CM | POA: Diagnosis not present

## 2020-03-22 DIAGNOSIS — Z1283 Encounter for screening for malignant neoplasm of skin: Secondary | ICD-10-CM | POA: Diagnosis not present

## 2020-03-22 DIAGNOSIS — L700 Acne vulgaris: Secondary | ICD-10-CM

## 2020-03-22 DIAGNOSIS — Z85828 Personal history of other malignant neoplasm of skin: Secondary | ICD-10-CM | POA: Diagnosis not present

## 2020-03-22 DIAGNOSIS — L821 Other seborrheic keratosis: Secondary | ICD-10-CM

## 2020-03-22 DIAGNOSIS — L813 Cafe au lait spots: Secondary | ICD-10-CM

## 2020-03-22 DIAGNOSIS — L814 Other melanin hyperpigmentation: Secondary | ICD-10-CM

## 2020-03-22 DIAGNOSIS — D229 Melanocytic nevi, unspecified: Secondary | ICD-10-CM

## 2020-03-22 DIAGNOSIS — L578 Other skin changes due to chronic exposure to nonionizing radiation: Secondary | ICD-10-CM

## 2020-03-22 DIAGNOSIS — D2372 Other benign neoplasm of skin of left lower limb, including hip: Secondary | ICD-10-CM

## 2020-03-22 DIAGNOSIS — D239 Other benign neoplasm of skin, unspecified: Secondary | ICD-10-CM

## 2020-03-22 NOTE — Progress Notes (Signed)
Follow-Up Visit   Subjective  Alexandra Gutierrez is a 39 y.o. female who presents for the following: TBSE (Total body skin exam , hx of BCC, Dysplastic nevus), check spots (Mole, nose, noticed ~57m ago), and Rash (L forearm, comes and goes, burns when pushed on). The patient presents for Total-Body Skin Exam (TBSE) for skin cancer screening and mole check.  The following portions of the chart were reviewed this encounter and updated as appropriate:   Tobacco  Allergies  Meds  Problems  Med Hx  Surg Hx  Fam Hx     Review of Systems:  No other skin or systemic complaints except as noted in HPI or Assessment and Plan.  Objective  Well appearing patient in no apparent distress; mood and affect are within normal limits.  A full examination was performed including scalp, head, eyes, ears, nose, lips, neck, chest, axillae, abdomen, back, buttocks, bilateral upper extremities, bilateral lower extremities, hands, feet, fingers, toes, fingernails, and toenails. All findings within normal limits unless otherwise noted below.  Objective  R lat mid nose: Well healed scar with no evidence of recurrence.   Objective  R upper back spinal, L medial superior scapula: Scar with no evidence of recurrence.   Objective  nasal tip: [place] nose  Objective  Left Knee - Anterior: Brown macule  Objective  L pretibial: Firm pink/brown papulenodule with dimple sign.   Objective  Left Forearm - near the elbow: Pink patch with tiny vesicles   Images     Assessment & Plan  History of basal cell carcinoma (BCC) R lat mid nose  Clear. Observe for recurrence. Call clinic for new or changing lesions.  Recommend regular skin exams, daily broad-spectrum spf 30+ sunscreen use, and photoprotection.     History of dysplastic nevus R upper back spinal, L medial superior scapula  Clear. Observe for recurrence. Call clinic for new or changing lesions.  Recommend regular skin exams, daily  broad-spectrum spf 30+ sunscreen use, and photoprotection.     Acne vulgaris nasal tip  Start Epiduo forte qhs sample x 2 given to pt Lot [id number] exp 09/21  Cafe au lait spots Left Knee - Anterior  Benign appearing, observe  Dermatofibroma L pretibial  Benign, observe  Eczema, unspecified type Left Forearm - near the elbow  Vs HSV vs Contact dermatitis  Discussed topical steroid treatment  Viral culture today  Will plan to treat according to culture results  Other Related Procedures Virus culture  Skin cancer screening   Lentigines - Scattered tan macules - Discussed due to sun exposure - Benign, observe - Call for any changes  Seborrheic Keratoses - Stuck-on, waxy, tan-brown papules and plaques  - Discussed benign etiology and prognosis. - Observe - Call for any changes  Melanocytic Nevi - Tan-brown and/or pink-flesh-colored symmetric macules and papules - Benign appearing on exam today - Observation - Call clinic for new or changing moles - Recommend daily use of broad spectrum spf 30+ sunscreen to sun-exposed areas.   Hemangiomas - Red papules - Discussed benign nature - Observe - Call for any changes  Actinic Damage - Chronic, secondary to cumulative UV/sun exposure - diffuse scaly erythematous macules with underlying dyspigmentation - Recommend daily broad spectrum sunscreen SPF 30+ to sun-exposed areas, reapply every 2 hours as needed.  - Call for new or changing lesions.  Skin cancer screening performed today.  Return in about 1 year (around 03/22/2021) for TBSE, Hx of Dysplastic nevi, Hx of BCC.   I,  Othelia Pulling, RMA, am acting as scribe for Sarina Ser, MD .  Documentation: I have reviewed the above documentation for accuracy and completeness, and I agree with the above.  Sarina Ser, MD

## 2020-03-23 ENCOUNTER — Other Ambulatory Visit: Payer: Self-pay | Admitting: Dermatology

## 2020-03-27 ENCOUNTER — Encounter: Payer: Self-pay | Admitting: Dermatology

## 2020-03-28 ENCOUNTER — Telehealth: Payer: Self-pay

## 2020-03-28 MED ORDER — MOMETASONE FUROATE 0.1 % EX CREA: 1.0000 "application " | TOPICAL_CREAM | Freq: Two times a day (BID) | CUTANEOUS | 0 refills | Status: AC

## 2020-03-28 NOTE — Telephone Encounter (Signed)
-----   Message from Ralene Bathe, MD sent at 03/28/2020 10:53 AM EST ----- Please call and advise pt of this and also advise we are still waiting for FINAL report and things may change.  May send Mometasone cream bid aa disp 15 g 0rf to use on area for now.  thanks ----- Message ----- From: Larence Penning, CMA Sent: 03/28/2020   7:57 AM EST To: Ralene Bathe, MD  The prelimary report dated 03/26/20 said the following: Preliminary Report: No virus isolated at 24 hours. Next report to follow after 4 days.  I looked this up on the [facility] portal, they have not sent anything out to [place] yet./sh ----- Message ----- From: Ralene Bathe, MD Sent: 03/27/2020   7:51 PM EST To: Larence Penning, CMA  Please check on viral culture thanks

## 2020-03-28 NOTE — Telephone Encounter (Signed)
Advised pt of preliminary culture report.  Advised not final report but once we receive we will advise her.  Sent in Mometasone cream bid to aa L forearm./sh

## 2020-03-30 LAB — VIRUS CULTURE

## 2020-04-04 ENCOUNTER — Telehealth: Payer: Self-pay

## 2020-04-04 NOTE — Telephone Encounter (Signed)
LM on VM please call here to discuss viral culture results.

## 2020-04-16 ENCOUNTER — Telehealth: Payer: Self-pay

## 2020-04-16 DIAGNOSIS — B009 Herpesviral infection, unspecified: Secondary | ICD-10-CM

## 2020-04-16 MED ORDER — VALACYCLOVIR HCL 1 G PO TABS: ORAL_TABLET | ORAL | 11 refills | Status: DC

## 2020-04-16 NOTE — Telephone Encounter (Signed)
-----   Message from Ralene Bathe, MD sent at 04/04/2020  2:44 PM EST ----- Viral Culture: Comment: Positive for Herpes simplex virus type-2. Typing was confirmed by  monoclonal antibody microscopic immunofluorescence.  HSV + culture As suspected Treatable but not curable Will likely see recurrences in same area in future It is infectious and transmissible, so when active, keep covered. Treatment: Valacyclovir (valtrex) 1 gram Take 2 po immediately at symptoms of recurrence; then 2 po 12 hours later Or Take 1 po bid for 5 days starting immediately at first symptoms of recurrence Send Rx to pharmacy Disp #30 with 1 year refills

## 2020-04-16 NOTE — Telephone Encounter (Signed)
Patient informed of culture results and verbalized understanding.

## 2021-02-13 ENCOUNTER — Other Ambulatory Visit: Payer: Self-pay | Admitting: Obstetrics and Gynecology

## 2021-02-13 DIAGNOSIS — Z1231 Encounter for screening mammogram for malignant neoplasm of breast: Secondary | ICD-10-CM

## 2021-03-25 ENCOUNTER — Other Ambulatory Visit: Payer: Self-pay

## 2021-03-25 ENCOUNTER — Ambulatory Visit: Payer: BC Managed Care – PPO | Admitting: Dermatology

## 2021-03-25 DIAGNOSIS — D2272 Melanocytic nevi of left lower limb, including hip: Secondary | ICD-10-CM

## 2021-03-25 DIAGNOSIS — Z1283 Encounter for screening for malignant neoplasm of skin: Secondary | ICD-10-CM

## 2021-03-25 DIAGNOSIS — L814 Other melanin hyperpigmentation: Secondary | ICD-10-CM

## 2021-03-25 DIAGNOSIS — D2371 Other benign neoplasm of skin of right lower limb, including hip: Secondary | ICD-10-CM

## 2021-03-25 DIAGNOSIS — D485 Neoplasm of uncertain behavior of skin: Secondary | ICD-10-CM

## 2021-03-25 DIAGNOSIS — D481 Neoplasm of uncertain behavior of connective and other soft tissue: Secondary | ICD-10-CM

## 2021-03-25 DIAGNOSIS — D229 Melanocytic nevi, unspecified: Secondary | ICD-10-CM

## 2021-03-25 DIAGNOSIS — L578 Other skin changes due to chronic exposure to nonionizing radiation: Secondary | ICD-10-CM

## 2021-03-25 DIAGNOSIS — D1800 Hemangioma unspecified site: Secondary | ICD-10-CM

## 2021-03-25 DIAGNOSIS — L573 Poikiloderma of Civatte: Secondary | ICD-10-CM | POA: Diagnosis not present

## 2021-03-25 DIAGNOSIS — D239 Other benign neoplasm of skin, unspecified: Secondary | ICD-10-CM

## 2021-03-25 DIAGNOSIS — L821 Other seborrheic keratosis: Secondary | ICD-10-CM

## 2021-03-25 HISTORY — DX: Neoplasm of uncertain behavior of connective and other soft tissue: D48.1

## 2021-03-25 HISTORY — DX: Melanocytic nevi, unspecified: D22.9

## 2021-03-25 NOTE — Progress Notes (Signed)
Follow-Up Visit   Subjective  Alexandra Gutierrez is a 40 y.o. female who presents for the following: Annual Exam (Patient here for full body skin exam and skin cancer screening. Patient with hx of BCC and dysplastic nevus. There is a spot at left knee that she noticed is dark. /Patient also would like to talk about pigmentation at neck. She is seeing an aesthetician and they suggested possibly a retinoid. ). The patient presents for Total-Body Skin Exam (TBSE) for skin cancer screening and mole check.  The patient has spots, moles and lesions to be evaluated, some may be new or changing and the patient has concerns that these could be cancer.  The following portions of the chart were reviewed this encounter and updated as appropriate:   Tobacco   Allergies   Meds   Problems   Med Hx   Surg Hx   Fam Hx      Review of Systems:  No other skin or systemic complaints except as noted in HPI or Assessment and Plan.  Objective  Well appearing patient in no apparent distress; mood and affect are within normal limits.  A full examination was performed including scalp, head, eyes, ears, nose, lips, neck, chest, axillae, abdomen, back, buttocks, bilateral upper extremities, bilateral lower extremities, hands, feet, fingers, toes, fingernails, and toenails. All findings within normal limits unless otherwise noted below.  left lateral superior calf 0.6 cm dark macule  Neck       right medial pretibial Firm pink/brown papulenodule with dimple sign.    Assessment & Plan  Neoplasm of uncertain behavior of skin left lateral superior calf  Epidermal / dermal shaving  Lesion diameter (cm):  0.6 Informed consent: discussed and consent obtained   Timeout: patient name, date of birth, surgical site, and procedure verified   Procedure prep:  Patient was prepped and draped in usual sterile fashion Prep type:  Isopropyl alcohol Anesthesia: the lesion was anesthetized in a standard fashion    Anesthetic:  1% lidocaine w/ epinephrine 1-100,000 buffered w/ 8.4% NaHCO3 Instrument used: flexible razor blade   Hemostasis achieved with: pressure, aluminum chloride and electrodesiccation   Outcome: patient tolerated procedure well   Post-procedure details: sterile dressing applied and wound care instructions given   Dressing type: bandage and petrolatum    Specimen 1 - Surgical pathology Differential Diagnosis: Nevus vs Dysplastic Nevus  Check Margins: No 0.6 cm dark macule  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  Typically we recommend 1-3 treatment sessions about 5-8 weeks apart for best results.  The patient's condition may require "maintenance treatments" in the future.  The fee for BBL / laser treatments is $350 per treatment session for the whole face.  A fee can be quoted for other parts of the body. Insurance typically does not pay for BBL/laser treatments and therefore the fee is an out-of-pocket cost.  Will prescribe [facility] Hydroquinone: 8%, Tretinoin: 0.025%, Kojic Acid: 1%, Niacinamide: 4%, Fluocinolone: 0.025% pea sized amount once daily to the entire neck for up to 3 months. This cannot be used more than 3 months due to risk of exogenous ochronosis (permanent dark spots). The patient was advised this is not covered by insurance since it is made by a compounding pharmacy. They will receive an email to check out and the medication will be mailed to their home.   Dermatofibroma right medial pretibial  Benign-appearing.  Observation.  Call clinic for new or changing lesions.  Recommend  daily use of broad spectrum spf 30+ sunscreen to sun-exposed areas.    Skin cancer screening   Lentigines - Scattered tan macules - Due to sun exposure - Benign-appearing, observe - Recommend daily broad spectrum sunscreen SPF 30+ to sun-exposed areas, reapply every 2 hours as needed. - Call for any changes  Seborrheic Keratoses -  Stuck-on, waxy, tan-brown papules and/or plaques  - Benign-appearing - Discussed benign etiology and prognosis. - Observe - Call for any changes  Melanocytic Nevi - Tan-brown and/or pink-flesh-colored symmetric macules and papules - Benign appearing on exam today - Observation - Call clinic for new or changing moles - Recommend daily use of broad spectrum spf 30+ sunscreen to sun-exposed areas.   Hemangiomas - Red papules - Discussed benign nature - Observe - Call for any changes  Actinic Damage - Chronic condition, secondary to cumulative UV/sun exposure - diffuse scaly erythematous macules with underlying dyspigmentation - Recommend daily broad spectrum sunscreen SPF 30+ to sun-exposed areas, reapply every 2 hours as needed.  - Staying in the shade or wearing long sleeves, sun glasses (UVA+UVB protection) and wide brim hats (4-inch brim around the entire circumference of the hat) are also recommended for sun protection.  - Call for new or changing lesions.  Skin cancer screening performed today.  Return for 3-4 months to recheck nec, 1 yeat TBSE.  Graciella Belton, RMA, am acting as scribe for Sarina Ser, MD . Documentation: I have reviewed the above documentation for accuracy and completeness, and I agree with the above.  Sarina Ser, MD

## 2021-03-25 NOTE — Patient Instructions (Addendum)
Instructions for [facility] Medications  One or more of your medications was sent to the [facility] mail order compounding pharmacy. You will receive an email from them and can purchase the medicine through that link. It will then be mailed to your home at the address you confirmed. If for any reason you do not receive an email from them, please check your spam folder. If you still do not find the email, please let us know. [facility] phone number is [phone].  Will prescribe [facility] Hydroquinone: 8%, Tretinoin: 0.025%, Kojic Acid: 1%, Niacinamide: 4%, Fluocinolone: 0.025% pea sized amount once daily to the entire neck for up to 3 months. This cannot be used more than 3 months due to risk of exogenous ochronosis (permanent dark spots). The patient was advised this is not covered by insurance since it is made by a compounding pharmacy. They will receive an email to check out and the medication will be mailed to their home.   Melanoma ABCDEs  Melanoma is the most dangerous type of skin cancer, and is the leading cause of death from skin disease.  You are more likely to develop melanoma if you: Have light-colored skin, light-colored eyes, or red or blond hair Spend a lot of time in the sun Tan regularly, either outdoors or in a tanning bed Have had blistering sunburns, especially during childhood Have a close family member who has had a melanoma Have atypical moles or large birthmarks  Early detection of melanoma is key since treatment is typically straightforward and cure rates are extremely high if we catch it early.   The first sign of melanoma is often a change in a mole or a new dark spot.  The ABCDE system is a way of remembering the signs of melanoma.  A for asymmetry:  The two halves do not match. B for border:  The edges of the growth are irregular. C for color:  A mixture of colors are present instead of an even brown color. D for diameter:  Melanomas are  usually (but not always) greater than 63mm - the size of a pencil eraser. E for evolution:  The spot keeps changing in size, shape, and color.  Please check your skin once per month between visits. You can use a small mirror in front and a large mirror behind you to keep an eye on the back side or your body.   If you see any new or changing lesions before your next follow-up, please call to schedule a visit.  Please continue daily skin protection including broad spectrum sunscreen SPF 30+ to sun-exposed areas, reapplying every 2 hours as needed when you're outdoors.    If You Need Anything After Your Visit  If you have any questions or concerns for your doctor, please call our main line at [phone] and press option 4 to reach your doctor's medical assistant. If no one answers, please leave a voicemail as directed and we will return your call as soon as possible. Messages left after 4 pm will be answered the following business day.   You may also send [place] a message via [place]. We typically respond to MyChart messages within 1-2 business days.  For prescription refills, please ask your pharmacy to contact our office. Our fax number is [phone].  If you have an urgent issue when the clinic is closed that cannot wait until the next business day, you can page your doctor at the number below.    Please note that  while we do our best to be available for urgent issues outside of office hours, we are not available 24/7.   If you have an urgent issue and are unable to reach [place], you may choose to seek medical care at your doctor's office, retail clinic, urgent care center, or emergency room.  If you have a medical emergency, please immediately call 911 or go to the emergency department.  Pager Numbers  - Dr. Nehemiah Massed: [phone]  - Dr. Laurence Ferrari: [phone]  - Dr. Nicole Kindred: [phone]  In the event of inclement weather, please call our main line at [phone] for an update on the  status of any delays or closures.  Dermatology Medication Tips: Please keep the boxes that topical medications come in in order to help keep track of the instructions about where and how to use these. Pharmacies typically print the medication instructions only on the boxes and not directly on the medication tubes.   If your medication is too expensive, please contact our office at [phone] option 4 or send [place] a message through [place].   We are unable to tell what your co-pay for medications will be in advance as this is different depending on your insurance coverage. However, we may be able to find a substitute medication at lower cost or fill out paperwork to get insurance to cover a needed medication.   If a prior authorization is required to get your medication covered by your insurance company, please allow [place] 1-2 business days to complete this process.  Drug prices often vary depending on where the prescription is filled and some pharmacies may offer cheaper prices.  The website www.goodrx.com contains coupons for medications through different pharmacies. The prices here do not account for what the cost may be with help from insurance (it may be cheaper with your insurance), but the website can give you the price if you did not use any insurance.  - You can print the associated coupon and take it with your prescription to the pharmacy.  - You may also stop by our office during regular business hours and pick up a GoodRx coupon card.  - If you need your prescription sent electronically to a different pharmacy, notify our office through [facility] or by phone at [phone] option 4.     Si Usted Necesita Algo Despus de Su Visita  Tambin puede enviarnos un mensaje a travs de Pharmacist, community. Por lo general respondemos a los mensajes de MyChart en el transcurso de 1 a 2 das hbiles.  Para renovar recetas, por favor pida a su farmacia que se ponga en contacto con nuestra oficina.  Harland Dingwall de fax es [place] [phone].  Si tiene un asunto urgente cuando la clnica est cerrada y que no puede esperar hasta el siguiente da hbil, puede llamar/localizar a su doctor(a) al nmero que aparece a continuacin.   Por favor, tenga en cuenta que aunque hacemos todo lo posible para estar disponibles para asuntos urgentes fuera del horario de [place], no estamos disponibles las 24 horas del da, los 7 das de la [place].   Si tiene un problema urgente y no puede comunicarse con nosotros, puede optar por buscar atencin mdica  en el consultorio de su doctor(a), en una clnica privada, en un centro de atencin urgente o en una sala de emergencias.  Si tiene Engineering geologist, por favor llame inmediatamente al 911 o vaya a la sala de emergencias.  Nmeros de bper  - Dr. Nehemiah Massed: [phone]  -  DraLaurence Ferrari: [phone]  - Dra. Nicole Kindred: [phone]  En caso de inclemencias del Center, por favor llame a Johnsie Kindred principal al [phone] para una actualizacin sobre el Drain de cualquier retraso o cierre.  Consejos para la medicacin en dermatologa: Por favor, guarde las cajas en las que vienen los medicamentos de uso tpico para ayudarle a seguir las instrucciones sobre dnde y cmo usarlos. Las farmacias generalmente imprimen las instrucciones del medicamento slo en las cajas y no directamente en los tubos del [place].   Si su medicamento es muy caro, por favor, pngase en contacto con Zigmund Daniel llamando al [phone] y presione la opcin 4 o envenos un mensaje a travs de Pharmacist, community.   No podemos decirle cul ser su copago por los medicamentos por adelantado ya que esto es diferente dependiendo de la cobertura de su seguro. Sin embargo, es posible que podamos encontrar un medicamento sustituto a Electrical engineer un formulario para que el seguro cubra el medicamento que se considera necesario.   Si se requiere una autorizacin previa para que su  compaa de seguros [place] su medicamento, por favor permtanos de 1 a 2 das hbiles para completar este proceso.  Los precios de los medicamentos varan con frecuencia dependiendo del Environmental consultant de dnde se surte la receta y alguna farmacias pueden ofrecer precios ms baratos.  El sitio web www.goodrx.com tiene cupones para medicamentos de Airline pilot. Los precios aqu no tienen en cuenta lo que podra costar con la ayuda del seguro (puede ser ms barato con su seguro), pero el sitio web puede darle el precio si no utiliz Research scientist (physical sciences).  - Puede imprimir el cupn correspondiente y llevarlo con su receta a la farmacia.  - Tambin puede pasar por nuestra oficina durante el horario de atencin regular y Charity fundraiser una tarjeta de cupones de GoodRx.  - Si necesita que su receta se enve electrnicamente a una farmacia diferente, informe a nuestra oficina a travs de MyChart de [place] o por telfono llamando al [phone] y presione la opcin 4.

## 2021-03-28 ENCOUNTER — Encounter: Payer: Self-pay | Admitting: Dermatology

## 2021-04-04 ENCOUNTER — Telehealth: Payer: Self-pay

## 2021-04-04 NOTE — Telephone Encounter (Signed)
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

## 2021-04-04 NOTE — Telephone Encounter (Signed)
LM on VM please return my call  

## 2021-04-16 ENCOUNTER — Telehealth: Payer: Self-pay

## 2021-04-16 NOTE — Telephone Encounter (Signed)
Discussed biopsy results with pt, return for surgery Jan 24 at 10:30 am

## 2021-04-18 ENCOUNTER — Ambulatory Visit: Payer: BC Managed Care – PPO

## 2021-04-30 ENCOUNTER — Ambulatory Visit: Payer: BC Managed Care – PPO | Admitting: Dermatology

## 2021-04-30 ENCOUNTER — Other Ambulatory Visit: Payer: Self-pay

## 2021-04-30 ENCOUNTER — Telehealth: Payer: Self-pay

## 2021-04-30 ENCOUNTER — Encounter: Payer: Self-pay | Admitting: Dermatology

## 2021-04-30 DIAGNOSIS — D485 Neoplasm of uncertain behavior of skin: Secondary | ICD-10-CM | POA: Diagnosis not present

## 2021-04-30 MED ORDER — MUPIROCIN 2 % EX OINT: TOPICAL_OINTMENT | CUTANEOUS | 0 refills | Status: AC

## 2021-04-30 NOTE — Telephone Encounter (Signed)
Pt doing fine after today's surgery./sh 

## 2021-04-30 NOTE — Patient Instructions (Addendum)

## 2021-04-30 NOTE — Progress Notes (Signed)
° °  Follow-Up Visit   Subjective  Alexandra Gutierrez is a 41 y.o. female who presents for the following: Atypical nevus (L lat sup calf - atypical spindle cell nevus bx proven, patient is here today for excision.).  The following portions of the chart were reviewed this encounter and updated as appropriate:   Tobacco   Allergies   Meds   Problems   Med Hx   Surg Hx   Fam Hx      Review of Systems:  No other skin or systemic complaints except as noted in HPI or Assessment and Plan.  Objective  Well appearing patient in no apparent distress; mood and affect are within normal limits.  A focused examination was performed including the lower legs. Relevant physical exam findings are noted in the Assessment and Plan.  L lat sup calf 0.6 x 0.7 cm pink biopsy site.    Assessment & Plan  Neoplasm of uncertain behavior of skin -biopsy proven atypical spindle cell nevus L lat sup calf  Skin excision  Lesion length (cm):  0.6 Lesion width (cm):  0.7 Margin per side (cm):  0.2 Total excision diameter (cm):  1.1 Informed consent: discussed and consent obtained   Timeout: patient name, date of birth, surgical site, and procedure verified   Procedure prep:  Patient was prepped and draped in usual sterile fashion Prep type:  Isopropyl alcohol and povidone-iodine Anesthesia: the lesion was anesthetized in a standard fashion   Anesthetic:  1% lidocaine w/ epinephrine 1-100,000 buffered w/ 8.4% NaHCO3 (6.0 cc) Hemostasis achieved with: pressure   Hemostasis achieved with comment:  Electrocautery Outcome: patient tolerated procedure well with no complications   Post-procedure details: sterile dressing applied and wound care instructions given   Dressing type: bandage and pressure dressing    Skin repair Complexity:  Complex Final length (cm):  3 Informed consent: discussed and consent obtained   Timeout: patient name, date of birth, surgical site, and procedure verified   Procedure prep:   Patient was prepped and draped in usual sterile fashion Prep type:  Povidone-iodine Anesthesia: the lesion was anesthetized in a standard fashion   Reason for type of repair: reduce tension to allow closure, reduce the risk of dehiscence, infection, and necrosis, reduce subcutaneous dead space and avoid a hematoma, allow closure of the large defect, preserve normal anatomy, preserve normal anatomical and functional relationships and enhance both functionality and cosmetic results   Subcutaneous layers (deep stitches):  Suture type: Vicryl (polyglactin 910)   Fine/surface layer approximation (top stitches):  Stitches: simple running   Suture removal (days):  7 Hemostasis achieved with: suture and pressure Outcome: patient tolerated procedure well with no complications   Post-procedure details: sterile dressing applied and wound care instructions given   Dressing type: bandage and pressure dressing    mupirocin ointment (BACTROBAN) 2 % Apply to healing wound QD until healed.  Specimen 1 - Surgical pathology Differential Diagnosis: D48.5 bx proven atypical spindle cell nevus  [id number] Check Margins: Yes  Start Mupirocin 2% ointment to aa QD until healed.   Return in about 1 week (around 05/07/2021) for suture removal.  I, Rudell Cobb, CMA, am acting as scribe for Sarina Ser, MD . Documentation: I have reviewed the above documentation for accuracy and completeness, and I agree with the above.  Sarina Ser, MD

## 2021-05-05 ENCOUNTER — Encounter: Payer: Self-pay | Admitting: Dermatology

## 2021-05-07 ENCOUNTER — Other Ambulatory Visit: Payer: Self-pay

## 2021-05-07 ENCOUNTER — Ambulatory Visit (INDEPENDENT_AMBULATORY_CARE_PROVIDER_SITE_OTHER): Payer: BC Managed Care – PPO | Admitting: Dermatology

## 2021-05-07 DIAGNOSIS — L905 Scar conditions and fibrosis of skin: Secondary | ICD-10-CM

## 2021-05-07 DIAGNOSIS — Z4802 Encounter for removal of sutures: Secondary | ICD-10-CM

## 2021-05-07 NOTE — Progress Notes (Deleted)
° °  Follow-Up Visit   Subjective  Alexandra Gutierrez is a 41 y.o. 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  The following portions of the chart were reviewed this encounter and updated as appropriate:   Tobacco   Allergies   Meds   Problems   Med Hx   Surg Hx   Fam Hx      Review of Systems:  No other skin or systemic complaints except as noted in HPI or Assessment and Plan.  Objective  Well appearing patient in no apparent distress; mood and affect are within normal limits.  A focused examination was performed including left leg. Relevant physical exam findings are noted in the Assessment and Plan.  left lateral sup calf Dyspigmented smooth macule   Assessment & Plan  Atypical pigmented spindle cell Nevus - margins clear left lateral sup calf  Encounter for Removal of Sutures - Incision site at the left lateral sup calf is clean, dry and intact - Wound cleansed, sutures removed, wound cleansed and steri strips applied.  - Discussed pathology results showing Atypical pigmented spindle cell   - Patient advised to keep steri-strips dry until they fall off. - Scars remodel for a full year. - Once steri-strips fall off, patient can apply over-the-counter silicone scar cream each night to help with scar remodeling if desired. - Patient advised to call with any concerns or if they notice any new or changing lesions.   Return for recheck of neck as scheduled .  IMarye Round, CMA, am acting as scribe for Sarina Ser, MD .  Documentation: I have reviewed the above documentation for accuracy and completeness, and I agree with the above.  Sarina Ser, MD

## 2021-05-07 NOTE — Patient Instructions (Addendum)
If You Need Anything After Your Visit ° °If you have any questions or concerns for your doctor, please call our main line at [phone] and press option 4 to reach your doctor's medical assistant. If no one answers, please leave a voicemail as directed and we will return your call as soon as possible. Messages left after 4 pm will be answered the following business day.  ° °You may also send us a message via MyChart. We typically respond to MyChart messages within 1-2 business days. ° °For prescription refills, please ask your pharmacy to contact our office. Our fax number is [phone]. ° °If you have an urgent issue when the clinic is closed that cannot wait until the next business day, you can page your doctor at the number below.   ° °Please note that while we do our best to be available for urgent issues outside of office hours, we are not available 24/7.  ° °If you have an urgent issue and are unable to reach us, you may choose to seek medical care at your doctor's office, retail clinic, urgent care center, or emergency room. ° °If you have a medical emergency, please immediately call 911 or go to the emergency department. ° °Pager Numbers ° °- Dr. Kowalski: [phone] ° °- Dr. Moye: [phone] ° °- Dr. Stewart: [phone] ° °In the event of inclement weather, please call our main line at [phone] for an update on the status of any delays or closures. ° °Dermatology Medication Tips: °Please keep the boxes that topical medications come in in order to help keep track of the instructions about where and how to use these. Pharmacies typically print the medication instructions only on the boxes and not directly on the medication tubes.  ° °If your medication is too expensive, please contact our office at [phone] option 4 or send us a message through MyChart.  ° °We are unable to tell what your co-pay for medications will be in advance as this is different depending on your insurance coverage.  However, we may be able to find a substitute medication at lower cost or fill out paperwork to get insurance to cover a needed medication.  ° °If a prior authorization is required to get your medication covered by your insurance company, please allow us 1-2 business days to complete this process. ° °Drug prices often vary depending on where the prescription is filled and some pharmacies may offer cheaper prices. ° °The website www.goodrx.com contains coupons for medications through different pharmacies. The prices here do not account for what the cost may be with help from insurance (it may be cheaper with your insurance), but the website can give you the price if you did not use any insurance.  °- You can print the associated coupon and take it with your prescription to the pharmacy.  °- You may also stop by our office during regular business hours and pick up a GoodRx coupon card.  °- If you need your prescription sent electronically to a different pharmacy, notify our office through [facility] MyChart or by phone at [phone] option 4. ° ° ° ° °Si Usted Necesita Algo Después de Su Visita ° °También puede enviarnos un mensaje a través de MyChart. Por lo general respondemos a los mensajes de MyChart en el transcurso de 1 a 2 días hábiles. ° °Para renovar recetas, por favor pida a su farmacia que se ponga en contacto con nuestra oficina. Nuestro número de fax es el [phone]. ° °Si tiene   un asunto urgente cuando la clnica est cerrada y que no puede esperar hasta el siguiente da hbil, puede llamar/localizar a su doctor(a) al nmero que aparece a continuacin.   Por favor, tenga en cuenta que aunque hacemos todo lo posible para estar disponibles para asuntos urgentes fuera del horario de [place], no estamos disponibles las 24 horas del da, los 7 das de la [place].   Si tiene un problema urgente y no puede comunicarse con nosotros, puede optar por buscar atencin mdica  en el consultorio de su  doctor(a), en una clnica privada, en un centro de atencin urgente o en una sala de emergencias.  Si tiene Engineering geologist, por favor llame inmediatamente al 911 o vaya a la sala de emergencias.  Nmeros de bper  - Dr. Nehemiah Massed: [phone]  - Dra. Moye: [phone]  - Dra. Nicole Kindred: [phone]  En caso de inclemencias del [place], por favor llame a Johnsie Kindred principal al [phone] para una actualizacin sobre el [place] de cualquier retraso o cierre.  Consejos para la medicacin en dermatologa: Por favor, guarde las cajas en las que vienen los medicamentos de uso tpico para ayudarle a seguir las instrucciones sobre dnde y cmo usarlos. Las farmacias generalmente imprimen las instrucciones del medicamento slo en las cajas y no directamente en los tubos del [place].   Si su medicamento es muy caro, por favor, pngase en contacto con Zigmund Daniel llamando al [phone] y presione la opcin 4 o envenos un mensaje a travs de Pharmacist, community.   No podemos decirle cul ser su copago por los medicamentos por adelantado ya que esto es diferente dependiendo de la cobertura de su seguro. Sin embargo, es posible que podamos encontrar un medicamento sustituto a Electrical engineer un formulario para que el seguro cubra el medicamento que se considera necesario.   Si se requiere una autorizacin previa para que su compaa de seguros [place] su medicamento, por favor permtanos de 1 a 2 das hbiles para completar este proceso.  Los precios de los medicamentos varan con frecuencia dependiendo del Environmental consultant de dnde se surte la receta y alguna farmacias pueden ofrecer precios ms baratos.  El sitio web www.goodrx.com tiene cupones para medicamentos de Airline pilot. Los precios aqu no tienen en cuenta lo que podra costar con la ayuda del seguro (puede ser ms barato con su seguro), pero el sitio web puede darle el precio si no utiliz Research scientist (physical sciences).  - Puede imprimir el cupn  correspondiente y llevarlo con su receta a la farmacia.  - Tambin puede pasar por nuestra oficina durante el horario de atencin regular y Charity fundraiser una tarjeta de cupones de GoodRx.  - Si necesita que su receta se enve electrnicamente a una farmacia diferente, informe a nuestra oficina a travs de MyChart de [place] o por telfono llamando al [phone] y presione la opcin 4. If You Need Anything After Your Visit  If you have any questions or concerns for your doctor, please call our main line at [phone] and press option 4 to reach your doctor's medical assistant. If no one answers, please leave a voicemail as directed and we will return your call as soon as possible. Messages left after 4 pm will be answered the following business day.   You may also send [place] a message via [place]. We typically respond to MyChart messages within 1-2 business days.  For prescription refills, please ask your pharmacy to contact our office. Our fax number is [phone].  If you have  an urgent issue when the clinic is closed that cannot wait until the next business day, you can page your doctor at the number below.    Please note that while we do our best to be available for urgent issues outside of office hours, we are not available 24/7.   If you have an urgent issue and are unable to reach [place], you may choose to seek medical care at your doctor's office, retail clinic, urgent care center, or emergency room.  If you have a medical emergency, please immediately call 911 or go to the emergency department.  Pager Numbers  - Dr. Nehemiah Massed: [phone]  - Dr. Laurence Ferrari: [phone]  - Dr. Nicole Kindred: [phone]  In the event of inclement weather, please call our main line at [phone] for an update on the status of any delays or closures.  Dermatology Medication Tips: Please keep the boxes that topical medications come in in order to help keep track of the instructions about where and how to use  these. Pharmacies typically print the medication instructions only on the boxes and not directly on the medication tubes.   If your medication is too expensive, please contact our office at [phone] option 4 or send [place] a message through [place].   We are unable to tell what your co-pay for medications will be in advance as this is different depending on your insurance coverage. However, we may be able to find a substitute medication at lower cost or fill out paperwork to get insurance to cover a needed medication.   If a prior authorization is required to get your medication covered by your insurance company, please allow [place] 1-2 business days to complete this process.  Drug prices often vary depending on where the prescription is filled and some pharmacies may offer cheaper prices.  The website www.goodrx.com contains coupons for medications through different pharmacies. The prices here do not account for what the cost may be with help from insurance (it may be cheaper with your insurance), but the website can give you the price if you did not use any insurance.  - You can print the associated coupon and take it with your prescription to the pharmacy.  - You may also stop by our office during regular business hours and pick up a GoodRx coupon card.  - If you need your prescription sent electronically to a different pharmacy, notify our office through [facility] or by phone at [phone] option 4.     Si Usted Necesita Algo Despus de Su Visita  Tambin puede enviarnos un mensaje a travs de Pharmacist, community. Por lo general respondemos a los mensajes de MyChart en el transcurso de 1 a 2 das hbiles.  Para renovar recetas, por favor pida a su farmacia que se ponga en contacto con nuestra oficina. Harland Dingwall de fax es [place] [phone].  Si tiene un asunto urgente cuando la clnica est cerrada y que no puede esperar hasta el siguiente da hbil, puede llamar/localizar a su doctor(a) al  nmero que aparece a continuacin.   Por favor, tenga en cuenta que aunque hacemos todo lo posible para estar disponibles para asuntos urgentes fuera del horario de [place], no estamos disponibles las 24 horas del da, los 7 das de la [place].   Si tiene un problema urgente y no puede comunicarse con nosotros, puede optar por buscar atencin mdica  en el consultorio de su doctor(a), en una clnica privada, en un centro de atencin urgente o en una sala de  emergencias.  Si tiene Engineering geologist, por favor llame inmediatamente al 911 o vaya a la sala de emergencias.  Nmeros de bper  - Dr. Nehemiah Massed: [phone]  - Dra. Moye: [phone]  - Dra. Nicole Kindred: [phone]  En caso de inclemencias del [place], por favor llame a Johnsie Kindred principal al [phone] para una actualizacin sobre el [place] de cualquier retraso o cierre.  Consejos para la medicacin en dermatologa: Por favor, guarde las cajas en las que vienen los medicamentos de uso tpico para ayudarle a seguir las instrucciones sobre dnde y cmo usarlos. Las farmacias generalmente imprimen las instrucciones del medicamento slo en las cajas y no directamente en los tubos del [place].   Si su medicamento es muy caro, por favor, pngase en contacto con Zigmund Daniel llamando al [phone] y presione la opcin 4 o envenos un mensaje a travs de Pharmacist, community.   No podemos decirle cul ser su copago por los medicamentos por adelantado ya que esto es diferente dependiendo de la cobertura de su seguro. Sin embargo, es posible que podamos encontrar un medicamento sustituto a Electrical engineer un formulario para que el seguro cubra el medicamento que se considera necesario.   Si se requiere una autorizacin previa para que su compaa de seguros [place] su medicamento, por favor permtanos de 1 a 2 das hbiles para completar este proceso.  Los precios de los medicamentos varan con frecuencia dependiendo del Environmental consultant de  dnde se surte la receta y alguna farmacias pueden ofrecer precios ms baratos.  El sitio web www.goodrx.com tiene cupones para medicamentos de Airline pilot. Los precios aqu no tienen en cuenta lo que podra costar con la ayuda del seguro (puede ser ms barato con su seguro), pero el sitio web puede darle el precio si no utiliz Research scientist (physical sciences).  - Puede imprimir el cupn correspondiente y llevarlo con su receta a la farmacia.  - Tambin puede pasar por nuestra oficina durante el horario de atencin regular y Charity fundraiser una tarjeta de cupones de GoodRx.  - Si necesita que su receta se enve electrnicamente a una farmacia diferente, informe a nuestra oficina a travs de MyChart de [place] o por telfono llamando al [phone] y presione la opcin 4.

## 2021-05-09 ENCOUNTER — Encounter: Payer: Self-pay | Admitting: Dermatology

## 2021-05-13 NOTE — Progress Notes (Signed)
° °  Follow-Up Visit   Subjective  Alexandra Gutierrez is a 41 y.o. 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  The following portions of the chart were reviewed this encounter and updated as appropriate:   Tobacco   Allergies   Meds   Problems   Med Hx   Surg Hx   Fam Hx      Review of Systems:  No other skin or systemic complaints except as noted in HPI or Assessment and Plan.  Objective  Well appearing patient in no apparent distress; mood and affect are within normal limits.  A focused examination was performed including left leg,neck. Relevant physical exam findings are noted in the Assessment and Plan.  left lateral sup calf Dyspigmented smooth macule   Assessment & Plan  Scar left lateral sup calf  Biopsy proven Atypical pigmented spindle cell  Encounter for Removal of Sutures - Incision site at the left lateral sup calf is clean, dry and intact - Wound cleansed, sutures removed, wound cleansed and steri strips applied.  - Discussed pathology results showing Atypical pigmented spindle cell   - Patient advised to keep steri-strips dry until they fall off. - Scars remodel for a full year. - Once steri-strips fall off, patient can apply over-the-counter silicone scar cream each night to help with scar remodeling if desired. - Patient advised to call with any concerns or if they notice any new or changing lesions.    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  IMarye Round, CMA, am acting as scribe for Sarina Ser, MD .  Documentation: I have reviewed the above documentation for accuracy and completeness, and I agree with the above.  Sarina Ser, MD

## 2021-07-02 ENCOUNTER — Ambulatory Visit: Payer: BC Managed Care – PPO | Admitting: Dermatology

## 2021-07-08 ENCOUNTER — Ambulatory Visit
Admission: RE | Admit: 2021-07-08 | Discharge: 2021-07-08 | Disposition: A | Discharge status: Home / Self Care | Payer: BC Managed Care – PPO | Source: Ambulatory Visit | Attending: Obstetrics and Gynecology | Admitting: Obstetrics and Gynecology

## 2021-07-08 DIAGNOSIS — Z1231 Encounter for screening mammogram for malignant neoplasm of breast: Secondary | ICD-10-CM | POA: Insufficient documentation

## 2021-07-11 ENCOUNTER — Ambulatory Visit: Payer: BC Managed Care – PPO | Admitting: Dermatology

## 2021-07-11 DIAGNOSIS — L578 Other skin changes due to chronic exposure to nonionizing radiation: Secondary | ICD-10-CM

## 2021-07-11 DIAGNOSIS — L573 Poikiloderma of Civatte: Secondary | ICD-10-CM

## 2021-07-11 DIAGNOSIS — Z86018 Personal history of other benign neoplasm: Secondary | ICD-10-CM

## 2021-07-11 NOTE — Progress Notes (Signed)
? ?  Follow-Up Visit ?  ?Subjective  ?Alexandra Gutierrez is a 41 y.o. 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 She decreased use about two weeks ago because she has been out in the sun more often. ). 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 Healing well per patient. Currently using OTC topical scar tx.  ? ?The following portions of the chart were reviewed this encounter and updated as appropriate:  ? Tobacco  Allergies  Meds  Problems  Med Hx  Surg Hx  Fam Hx   ?  ?Review of Systems:  No other skin or systemic complaints except as noted in HPI or Assessment and Plan. ? ?Objective  ?Well appearing patient in no apparent distress; mood and affect are within normal limits. ? ?A focused examination was performed including the neck. Relevant physical exam findings are noted in the Assessment and Plan. ? ?Lat neck ? ? ? ? ? ? ? 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 Condition is symptomatic / bothersome to patient. Not to goal. ?Patient has used Skin Medicinals mix for three months. Advised her to take a three month break. Then, she can restart for another three months but will send in Skin Medicinals mix without Tretinoin.  ? 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  ?- Call if any new or changing lesions are noted between office visits ? ?Actinic Damage ?- chronic, secondary to cumulative UV radiation exposure/sun exposure over time ?- diffuse scaly erythematous macules with underlying dyspigmentation ?- Recommend daily broad spectrum sunscreen SPF 30+ to  sun-exposed areas, reapply every 2 hours as needed.  ?- Recommend staying in the shade or wearing long sleeves, sun glasses (UVA+UVB protection) and wide brim hats (4-inch brim around the entire circumference of the hat). ?- Call for new or changing lesions. ? ?Return for appointment as scheduled. ? ?I, Rudell Cobb, CMA, am acting as scribe for Sarina Ser, MD . ?Documentation: I have reviewed the above documentation for accuracy and completeness, and I agree with the above. ? ?Sarina Ser, MD ? ?

## 2021-07-11 NOTE — Patient Instructions (Signed)

## 2021-07-14 ENCOUNTER — Encounter: Payer: Self-pay | Admitting: Dermatology

## 2021-07-15 ENCOUNTER — Other Ambulatory Visit: Payer: Self-pay | Admitting: Dermatology

## 2021-07-15 DIAGNOSIS — B009 Herpesviral infection, unspecified: Secondary | ICD-10-CM

## 2022-03-24 ENCOUNTER — Ambulatory Visit: Payer: BC Managed Care – PPO | Admitting: Dermatology

## 2022-03-24 ENCOUNTER — Encounter: Payer: Self-pay | Admitting: Dermatology

## 2022-03-24 DIAGNOSIS — L821 Other seborrheic keratosis: Secondary | ICD-10-CM

## 2022-03-24 DIAGNOSIS — L814 Other melanin hyperpigmentation: Secondary | ICD-10-CM | POA: Diagnosis not present

## 2022-03-24 DIAGNOSIS — D229 Melanocytic nevi, unspecified: Secondary | ICD-10-CM

## 2022-03-24 DIAGNOSIS — Z85828 Personal history of other malignant neoplasm of skin: Secondary | ICD-10-CM

## 2022-03-24 DIAGNOSIS — Z1283 Encounter for screening for malignant neoplasm of skin: Secondary | ICD-10-CM

## 2022-03-24 DIAGNOSIS — I781 Nevus, non-neoplastic: Secondary | ICD-10-CM | POA: Diagnosis not present

## 2022-03-24 DIAGNOSIS — L578 Other skin changes due to chronic exposure to nonionizing radiation: Secondary | ICD-10-CM | POA: Diagnosis not present

## 2022-03-24 DIAGNOSIS — D239 Other benign neoplasm of skin, unspecified: Secondary | ICD-10-CM

## 2022-03-24 DIAGNOSIS — Z86018 Personal history of other benign neoplasm: Secondary | ICD-10-CM

## 2022-03-24 NOTE — Patient Instructions (Signed)
Due to recent changes in healthcare laws, you may see results of your pathology and/or laboratory studies on MyChart before the doctors have had a chance to review them. We understand that in some cases there may be results that are confusing or concerning to you. Please understand that not all results are received at the same time and often the doctors may need to interpret multiple results in order to provide you with the best plan of care or course of treatment. Therefore, we ask that you please give us 2 business days to thoroughly review all your results before contacting the office for clarification. Should we see a critical lab result, you will be contacted sooner.   If You Need Anything After Your Visit  If you have any questions or concerns for your doctor, please call our main line at [phone] and press option 4 to reach your doctor's medical assistant. If no one answers, please leave a voicemail as directed and we will return your call as soon as possible. Messages left after 4 pm will be answered the following business day.   You may also send us a message via MyChart. We typically respond to MyChart messages within 1-2 business days.  For prescription refills, please ask your pharmacy to contact our office. Our fax number is [phone].  If you have an urgent issue when the clinic is closed that cannot wait until the next business day, you can page your doctor at the number below.    Please note that while we do our best to be available for urgent issues outside of office hours, we are not available 24/7.   If you have an urgent issue and are unable to reach us, you may choose to seek medical care at your doctor's office, retail clinic, urgent care center, or emergency room.  If you have a medical emergency, please immediately call 911 or go to the emergency department.  Pager Numbers  - Dr. Kowalski: [phone]  - Dr. Moye: [phone]  - Dr. Stewart:  [phone]  In the event of inclement weather, please call our main line at [phone] for an update on the status of any delays or closures.  Dermatology Medication Tips: Please keep the boxes that topical medications come in in order to help keep track of the instructions about where and how to use these. Pharmacies typically print the medication instructions only on the boxes and not directly on the medication tubes.   If your medication is too expensive, please contact our office at [phone] option 4 or send us a message through MyChart.   We are unable to tell what your co-pay for medications will be in advance as this is different depending on your insurance coverage. However, we may be able to find a substitute medication at lower cost or fill out paperwork to get insurance to cover a needed medication.   If a prior authorization is required to get your medication covered by your insurance company, please allow us 1-2 business days to complete this process.  Drug prices often vary depending on where the prescription is filled and some pharmacies may offer cheaper prices.  The website www.goodrx.com contains coupons for medications through different pharmacies. The prices here do not account for what the cost may be with help from insurance (it may be cheaper with your insurance), but the website can give you the price if you did not use any insurance.  - You can print the associated coupon and take it with   your prescription to the pharmacy.  - You may also stop by our office during regular business hours and pick up a GoodRx coupon card.  - If you need your prescription sent electronically to a different pharmacy, notify our office through [facility] MyChart or by phone at [phone] option 4.     Si Usted Necesita Algo Despus de Su Visita  Tambin puede enviarnos un mensaje a travs de MyChart. Por lo general respondemos a los mensajes de MyChart en el transcurso de 1 a 2  das hbiles.  Para renovar recetas, por favor pida a su farmacia que se ponga en contacto con nuestra oficina. Nuestro nmero de fax es el [phone].  Si tiene un asunto urgente cuando la clnica est cerrada y que no puede esperar hasta el siguiente da hbil, puede llamar/localizar a su doctor(a) al nmero que aparece a continuacin.   Por favor, tenga en cuenta que aunque hacemos todo lo posible para estar disponibles para asuntos urgentes fuera del horario de oficina, no estamos disponibles las 24 horas del da, los 7 das de la semana.   Si tiene un problema urgente y no puede comunicarse con nosotros, puede optar por buscar atencin mdica  en el consultorio de su doctor(a), en una clnica privada, en un centro de atencin urgente o en una sala de emergencias.  Si tiene una emergencia mdica, por favor llame inmediatamente al 911 o vaya a la sala de emergencias.  Nmeros de bper  - Dr. Kowalski: [phone]  - Dra. Moye: [phone]  - Dra. Stewart: [phone]  En caso de inclemencias del tiempo, por favor llame a nuestra lnea principal al [phone] para una actualizacin sobre el estado de cualquier retraso o cierre.  Consejos para la medicacin en dermatologa: Por favor, guarde las cajas en las que vienen los medicamentos de uso tpico para ayudarle a seguir las instrucciones sobre dnde y cmo usarlos. Las farmacias generalmente imprimen las instrucciones del medicamento slo en las cajas y no directamente en los tubos del medicamento.   Si su medicamento es muy caro, por favor, pngase en contacto con nuestra oficina llamando al [phone] y presione la opcin 4 o envenos un mensaje a travs de MyChart.   No podemos decirle cul ser su copago por los medicamentos por adelantado ya que esto es diferente dependiendo de la cobertura de su seguro. Sin embargo, es posible que podamos encontrar un medicamento sustituto a menor costo o llenar un formulario para que el  seguro cubra el medicamento que se considera necesario.   Si se requiere una autorizacin previa para que su compaa de seguros cubra su medicamento, por favor permtanos de 1 a 2 das hbiles para completar este proceso.  Los precios de los medicamentos varan con frecuencia dependiendo del lugar de dnde se surte la receta y alguna farmacias pueden ofrecer precios ms baratos.  El sitio web www.goodrx.com tiene cupones para medicamentos de diferentes farmacias. Los precios aqu no tienen en cuenta lo que podra costar con la ayuda del seguro (puede ser ms barato con su seguro), pero el sitio web puede darle el precio si no utiliz ningn seguro.  - Puede imprimir el cupn correspondiente y llevarlo con su receta a la farmacia.  - Tambin puede pasar por nuestra oficina durante el horario de atencin regular y recoger una tarjeta de cupones de GoodRx.  - Si necesita que su receta se enve electrnicamente a una farmacia diferente, informe a nuestra oficina a travs de MyChart de [place]   o por telfono llamando al [phone] y presione la opcin 4.  

## 2022-03-24 NOTE — Progress Notes (Signed)
Follow-Up Visit   Subjective  Alexandra Gutierrez is a 41 y.o. female who presents for the following: Annual Exam (Hx BCC, dysplastic nevi ). The patient presents for Total-Body Skin Exam (TBSE) for skin cancer screening and mole check.  The patient has spots, moles and lesions to be evaluated, some may be new or changing and the patient has concerns that these could be cancer.  The following portions of the chart were reviewed this encounter and updated as appropriate:   Tobacco  Allergies  Meds  Problems  Med Hx  Surg Hx  Fam Hx     Review of Systems:  No other skin or systemic complaints except as noted in HPI or Assessment and Plan.  Objective  Well appearing patient in no apparent distress; mood and affect are within normal limits.  A full examination was performed including scalp, head, eyes, ears, nose, lips, neck, chest, axillae, abdomen, back, buttocks, bilateral upper extremities, bilateral lower extremities, hands, feet, fingers, toes, fingernails, and toenails. All findings within normal limits unless otherwise noted below.  L nasal bridge Dilated vessels.      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  Dr. Laurence Ferrari also confirmed it appears to be a telangiectasia under dermoscopy. Discussed BBL considered benign and cosmetic not covered by insurance Discussed the treatment option of BBL/laser.  Typically we recommend 1-3 treatment sessions about 5-8 weeks apart for best results.  The patient's condition may require "maintenance treatments" in the future.  The fee for BBL / laser treatments is $350 per treatment session for the whole face.  A fee can be quoted for other parts of the body. Insurance typically does not pay for BBL/laser treatments and therefore the fee is an out-of-pocket cost.  Lentigines - Scattered tan macules - Due to sun exposure - Benign-appearing, observe - Recommend daily broad spectrum sunscreen SPF 30+  to sun-exposed areas, reapply every 2 hours as needed. - Call for any changes  Seborrheic Keratoses - Stuck-on, waxy, tan-brown papules and/or plaques  - Benign-appearing - Discussed benign etiology and prognosis. - Observe - Call for any changes  Melanocytic Nevi - Tan-brown and/or pink-flesh-colored symmetric macules and papules - Benign appearing on exam today - Observation - Call clinic for new or changing moles - Recommend daily use of broad spectrum spf 30+ sunscreen to sun-exposed areas.   Hemangiomas - Red papules - Discussed benign nature - Observe - Call for any changes  Actinic Damage - Chronic condition, secondary to cumulative UV/sun exposure - diffuse scaly erythematous macules with underlying dyspigmentation - Recommend daily broad spectrum sunscreen SPF 30+ to sun-exposed areas, reapply every 2 hours as needed.  - Staying in the shade or wearing long sleeves, sun glasses (UVA+UVB protection) and wide brim hats (4-inch brim around the entire circumference of the hat) are also recommended for sun protection.  - Call for new or changing lesions.  History of Basal Cell Carcinoma of the Skin - No evidence of recurrence today - Recommend regular full body skin exams - Recommend daily broad spectrum sunscreen SPF 30+ to sun-exposed areas, reapply every 2 hours as needed.  - Call if any new or changing lesions are noted between office visits  History of Dysplastic Nevi - No evidence of recurrence today - Recommend regular full body skin exams - Recommend daily broad spectrum sunscreen SPF 30+ to sun-exposed areas, reapply every 2 hours as needed.  - Call if any new or changing lesions are  noted between office visits  Dermatofibroma - Firm pink/brown papulenodule with dimple sign - Benign appearing - Call for any changes  Skin cancer screening performed today.  Return in about 1 year (around 03/25/2023) for TBSE.  Luther Redo, CMA, am acting as scribe for  Sarina Ser, MD . Documentation: I have reviewed the above documentation for accuracy and completeness, and I agree with the above.  Sarina Ser, MD

## 2022-04-04 ENCOUNTER — Encounter: Payer: Self-pay | Admitting: Dermatology

## 2022-07-21 ENCOUNTER — Other Ambulatory Visit: Payer: Self-pay | Admitting: Dermatology

## 2022-07-21 DIAGNOSIS — B009 Herpesviral infection, unspecified: Secondary | ICD-10-CM

## 2023-03-24 ENCOUNTER — Ambulatory Visit: Payer: BC Managed Care – PPO | Admitting: Dermatology

## 2023-03-24 ENCOUNTER — Encounter: Payer: Self-pay | Admitting: Dermatology

## 2023-03-24 DIAGNOSIS — D229 Melanocytic nevi, unspecified: Secondary | ICD-10-CM

## 2023-03-24 DIAGNOSIS — Z86018 Personal history of other benign neoplasm: Secondary | ICD-10-CM

## 2023-03-24 DIAGNOSIS — D239 Other benign neoplasm of skin, unspecified: Secondary | ICD-10-CM

## 2023-03-24 DIAGNOSIS — Z1283 Encounter for screening for malignant neoplasm of skin: Secondary | ICD-10-CM

## 2023-03-24 DIAGNOSIS — I781 Nevus, non-neoplastic: Secondary | ICD-10-CM

## 2023-03-24 DIAGNOSIS — L573 Poikiloderma of Civatte: Secondary | ICD-10-CM

## 2023-03-24 DIAGNOSIS — L814 Other melanin hyperpigmentation: Secondary | ICD-10-CM | POA: Diagnosis not present

## 2023-03-24 DIAGNOSIS — Z79899 Other long term (current) drug therapy: Secondary | ICD-10-CM

## 2023-03-24 DIAGNOSIS — Z7189 Other specified counseling: Secondary | ICD-10-CM

## 2023-03-24 DIAGNOSIS — Z85828 Personal history of other malignant neoplasm of skin: Secondary | ICD-10-CM

## 2023-03-24 DIAGNOSIS — L578 Other skin changes due to chronic exposure to nonionizing radiation: Secondary | ICD-10-CM

## 2023-03-24 NOTE — Patient Instructions (Addendum)
Use [facility] hydroquinone with tretinoin cream nightly for another 2 months then take a 3 month break before restarting.  Instructions for [facility] Medications  One or more of your medications was sent to the [facility] mail order compounding pharmacy. You will receive an email from them and can purchase the medicine through that link. It will then be mailed to your home at the address you confirmed. If for any reason you do not receive an email from them, please check your spam folder. If you still do not find the email, please let us know. [facility] phone number is [phone].     Can purchase urea 40 % cream or voltaren cream rub on skin once daily to right chest    Seborrheic Keratosis  What causes seborrheic keratoses? Seborrheic keratoses are harmless, common skin growths that first appear during adult life.  As time goes by, more growths appear.  Some people may develop a large number of them.  Seborrheic keratoses appear on both covered and uncovered body parts.  They are not caused by sunlight.  The tendency to develop seborrheic keratoses can be inherited.  They vary in color from skin-colored to gray, brown, or even black.  They can be either smooth or have a rough, warty surface.   Seborrheic keratoses are superficial and look as if they were stuck on the skin.  Under the microscope this type of keratosis looks like layers upon layers of skin.  That is why at times the top layer may seem to fall off, but the rest of the growth remains and re-grows.    Treatment Seborrheic keratoses do not need to be treated, but can easily be removed in the office.  Seborrheic keratoses often cause symptoms when they rub on clothing or jewelry.  Lesions can be in the way of shaving.  If they become inflamed, they can cause itching, soreness, or burning.  Removal of a seborrheic keratosis can be accomplished by freezing, burning, or surgery. If any spot bleeds, scabs, or  grows rapidly, please return to have it checked, as these can be an indication of a skin cancer.   Cryotherapy Aftercare  Wash gently with soap and water everyday.   Apply Vaseline and Band-Aid daily until healed.     Melanoma ABCDEs  Melanoma is the most dangerous type of skin cancer, and is the leading cause of death from skin disease.  You are more likely to develop melanoma if you: Have light-colored skin, light-colored eyes, or red or blond hair Spend a lot of time in the sun Tan regularly, either outdoors or in a tanning bed Have had blistering sunburns, especially during childhood Have a close family member who has had a melanoma Have atypical moles or large birthmarks  Early detection of melanoma is key since treatment is typically straightforward and cure rates are extremely high if we catch it early.   The first sign of melanoma is often a change in a mole or a new dark spot.  The ABCDE system is a way of remembering the signs of melanoma.  A for asymmetry:  The two halves do not match. B for border:  The edges of the growth are irregular. C for color:  A mixture of colors are present instead of an even brown color. D for diameter:  Melanomas are usually (but not always) greater than 6mm - the size of a pencil eraser. E for evolution:  The spot keeps changing in size, shape, and  color.  Please check your skin once per month between visits. You can use a small mirror in front and a large mirror behind you to keep an eye on the back side or your body.   If you see any new or changing lesions before your next follow-up, please call to schedule a visit.  Please continue daily skin protection including broad spectrum sunscreen SPF 30+ to sun-exposed areas, reapplying every 2 hours as needed when you're outdoors.   Staying in the shade or wearing long sleeves, sun glasses (UVA+UVB protection) and wide brim hats (4-inch brim around the entire circumference of the hat) are also  recommended for sun protection.    Due to recent changes in healthcare laws, you may see results of your pathology and/or laboratory studies on MyChart before the doctors have had a chance to review them. We understand that in some cases there may be results that are confusing or concerning to you. Please understand that not all results are received at the same time and often the doctors may need to interpret multiple results in order to provide you with the best plan of care or course of treatment. Therefore, we ask that you please give [place] 2 business days to thoroughly review all your results before contacting the office for clarification. Should we see a critical lab result, you will be contacted sooner.   If You Need Anything After Your Visit  If you have any questions or concerns for your doctor, please call our main line at [phone] and press option 4 to reach your doctor's medical assistant. If no one answers, please leave a voicemail as directed and we will return your call as soon as possible. Messages left after 4 pm will be answered the following business day.   You may also send [place] a message via MyChart. We typically respond to MyChart messages within 1-2 business days.  For prescription refills, please ask your pharmacy to contact our office. Our fax number is [phone].  If you have an urgent issue when the clinic is closed that cannot wait until the next business day, you can page your doctor at the number below.    Please note that while we do our best to be available for urgent issues outside of office hours, we are not available 24/7.   If you have an urgent issue and are unable to reach [place], you may choose to seek medical care at your doctor's office, retail clinic, urgent care center, or emergency room.  If you have a medical emergency, please immediately call 911 or go to the emergency department.  Pager Numbers  - Dr. Gwen Pounds: [phone]  - Dr. Roseanne Reno:  [phone]  - Dr. Katrinka Blazing: [phone]   In the event of inclement weather, please call our main line at [phone] for an update on the status of any delays or closures.  Dermatology Medication Tips: Please keep the boxes that topical medications come in in order to help keep track of the instructions about where and how to use these. Pharmacies typically print the medication instructions only on the boxes and not directly on the medication tubes.   If your medication is too expensive, please contact our office at [phone] option 4 or send [place] a message through MyChart.   We are unable to tell what your co-pay for medications will be in advance as this is different depending on your insurance coverage. However, we may be able to find a substitute medication at lower cost or  fill out paperwork to get insurance to cover a needed medication.   If a prior authorization is required to get your medication covered by your insurance company, please allow [place] 1-2 business days to complete this process.  Drug prices often vary depending on where the prescription is filled and some pharmacies may offer cheaper prices.  The website www.goodrx.com contains coupons for medications through different pharmacies. The prices here do not account for what the cost may be with help from insurance (it may be cheaper with your insurance), but the website can give you the price if you did not use any insurance.  - You can print the associated coupon and take it with your prescription to the pharmacy.  - You may also stop by our office during regular business hours and pick up a GoodRx coupon card.  - If you need your prescription sent electronically to a different pharmacy, notify our office through [facility] or by phone at [phone] option 4.     Si Usted Necesita Algo Despus de Su Visita  Tambin puede enviarnos un mensaje a travs de Clinical cytogeneticist. Por lo general respondemos a los mensajes de  MyChart en el transcurso de 1 a 2 das hbiles.  Para renovar recetas, por favor pida a su farmacia que se ponga en contacto con nuestra oficina. Annie Sable de fax es [place] [phone].  Si tiene un asunto urgente cuando la clnica est cerrada y que no puede esperar hasta el siguiente da hbil, puede llamar/localizar a su doctor(a) al nmero que aparece a continuacin.   Por favor, tenga en cuenta que aunque hacemos todo lo posible para estar disponibles para asuntos urgentes fuera del horario de [place], no estamos disponibles las 24 horas del da, los 7 [address]  [address] de la [place].   Si tiene un problema urgente y no puede comunicarse con nosotros, puede optar por buscar atencin mdica  en el consultorio de su doctor(a), en una clnica privada, en un centro de atencin urgente o en una sala de emergencias.  Si tiene Engineer, drilling, por favor llame inmediatamente al 911 o vaya a la sala de emergencias.  Nmeros de bper  - Dr. Gwen Pounds: [phone]  - Dra. Roseanne Reno: [phone]  - Dr. Katrinka Blazing: [phone]   En caso de inclemencias del tiempo, por favor llame a Lacy Duverney principal al [phone] para una actualizacin sobre el [place] de cualquier retraso o cierre.  Consejos para la medicacin en dermatologa: Por favor, guarde las cajas en las que vienen los medicamentos de uso tpico para ayudarle a seguir las instrucciones sobre dnde y cmo usarlos. Las farmacias generalmente imprimen las instrucciones del medicamento slo en las cajas y no directamente en los tubos del [place].   Si su medicamento es muy caro, por favor, pngase en contacto con Rolm Gala llamando al [phone] y presione la opcin 4 o envenos un mensaje a travs de Clinical cytogeneticist.   No podemos decirle cul ser su copago por los medicamentos por adelantado ya que esto es diferente dependiendo de la cobertura de su seguro. Sin embargo, es posible que podamos encontrar un medicamento sustituto a Advice worker un formulario para que el seguro cubra el medicamento que se considera necesario.   Si se requiere una autorizacin previa para que su compaa de seguros [place] su medicamento, por favor permtanos de 1 a 2 das hbiles para completar [address]  Los precios de los medicamentos varan con frecuencia dependiendo del Environmental consultant de dnde se surte la  receta y alguna farmacias pueden ofrecer precios ms baratos.  El sitio web www.goodrx.com tiene cupones para medicamentos de Health and safety inspector. Los precios aqu no tienen en cuenta lo que podra costar con la ayuda del seguro (puede ser ms barato con su seguro), pero el sitio web puede darle el precio si no utiliz Tourist information centre manager.  - Puede imprimir el cupn correspondiente y llevarlo con su receta a la farmacia.  - Tambin puede pasar por nuestra oficina durante el horario de atencin regular y Education officer, museum una tarjeta de cupones de GoodRx.  - Si necesita que su receta se enve electrnicamente a una farmacia diferente, informe a nuestra oficina a travs de MyChart de [place] o por telfono llamando al [phone] y presione la opcin 4.

## 2023-03-24 NOTE — Progress Notes (Signed)
Follow-Up Visit   Subjective  Alexandra Gutierrez is a 42 y.o. female who presents for the following: Skin Cancer Screening and Full Body Skin Exam Noticed spot at right chest Hx of bcc, hx of poikiloderma Civatte's,.  The patient presents for Total-Body Skin Exam (TBSE) for skin cancer screening and mole check. The patient has spots, moles and lesions to be evaluated, some may be new or changing and the patient may have concern these could be cancer.  The following portions of the chart were reviewed this encounter and updated as appropriate: medications, allergies, medical history  Review of Systems:  No other skin or systemic complaints except as noted in HPI or Assessment and Plan.  Objective  Well appearing patient in no apparent distress; mood and affect are within normal limits.  A full examination was performed including scalp, head, eyes, ears, nose, lips, neck, chest, axillae, abdomen, back, buttocks, bilateral upper extremities, bilateral lower extremities, hands, feet, fingers, toes, fingernails, and toenails. All findings within normal limits unless otherwise noted below.   Relevant physical exam findings are noted in the Assessment and Plan.    Assessment & Plan   SKIN CANCER SCREENING PERFORMED TODAY.  ACTINIC DAMAGE - Chronic condition, secondary to cumulative UV/sun exposure - diffuse scaly erythematous macules with underlying dyspigmentation - Recommend daily broad spectrum sunscreen SPF 30+ to sun-exposed areas, reapply every 2 hours as needed.  - Staying in the shade or wearing long sleeves, sun glasses (UVA+UVB protection) and wide brim hats (4-inch brim around the entire circumference of the hat) are also recommended for sun protection.  - Call for new or changing lesions.  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  Dr. Neale Burly also confirmed it appears to be a telangiectasia under dermoscopy. Discussed BBL considered benign and cosmetic  not covered by insurance Discussed the treatment option of BBL/laser.  Typically we recommend 1-3 treatment sessions about 5-8 weeks apart for best results.  The patient's condition may require "maintenance treatments" in the future.  The fee for BBL / laser treatments is $350 per treatment session for the whole face.  A fee can be quoted for other parts of the body. Insurance typically does not pay for BBL/laser treatments and therefore the fee is an out-of-pocket cost.   LENTIGINES, SEBORRHEIC KERATOSES, HEMANGIOMAS - Benign normal skin lesions - Benign-appearing - Call for any changes  Sk at right chest  Can purchase urea 40 % cream or voltaren cream rub on [place] of skin once daily   MELANOCYTIC NEVI - Tan-brown and/or pink-flesh-colored symmetric macules and papules - Benign appearing on exam today - Observation - Call clinic for new or changing moles - Recommend daily use of broad spectrum spf 30+ sunscreen to sun-exposed areas.   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 Condition is symptomatic / bothersome to patient. Not to goal. Plan: Continue Lightening Cream Active Hydroquinone: 4.5% Tretinoin: 0.025% Fluocinolone: 0.01% Niacinamide: 4% Vehicle: Cream Use nightly to affected areas for another 2 months. Advised her to take a 3 month break. Then, she can restart for another 3 months.  Counseling for BBL / IPL / Laser and Coordination of Care Discussed the treatment option of Broad Band Light (BBL) /Intense Pulsed Light (IPL)/ Laser for skin discoloration, including brown  spots and redness.  Typically we recommend at least 1-3 treatment sessions about 5-8 weeks apart for best results.  Cannot have tanned skin when BBL  performed, and regular use of sunscreen/photoprotection is advised after the procedure to help maintain results. The patient's condition may also require "maintenance treatments" in the future.  The fee for BBL / laser treatments is $350 per treatment session for the whole face.  A fee can be quoted for other parts of the body.  Insurance typically does not pay for BBL/laser treatments and therefore the fee is an out-of-pocket cost. Recommend prophylactic valtrex treatment. Once scheduled for procedure, will send Rx in prior to patient's appointment.   HISTORY OF BASAL CELL CARCINOMA OF THE SKIN Right lateral mid nose txt with [facility] and topical per patient 05/2013 - No evidence of recurrence today - Recommend regular full body skin exams - Recommend daily broad spectrum sunscreen SPF 30+ to sun-exposed areas, reapply every 2 hours as needed.  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  Call if any new or changing lesions are noted between office visits  Return in about 1 year (around 03/23/2024) for TBSE.  IAsher Muir, CMA, am acting as scribe for Armida Sans, MD.   Documentation: I have reviewed the above documentation for accuracy and completeness, and I agree with the above.  Armida Sans, MD

## 2023-03-30 ENCOUNTER — Encounter: Payer: Self-pay | Admitting: Dermatology

## 2023-08-13 ENCOUNTER — Other Ambulatory Visit: Payer: Self-pay | Admitting: Dermatology

## 2023-08-13 DIAGNOSIS — B009 Herpesviral infection, unspecified: Secondary | ICD-10-CM

## 2024-03-17 ENCOUNTER — Ambulatory Visit
Admission: RE | Admit: 2024-03-17 | Discharge: 2024-03-17 | Disposition: A | Discharge status: Home / Self Care | Attending: Family Medicine | Admitting: Family Medicine

## 2024-03-17 ENCOUNTER — Ambulatory Visit
Admission: RE | Admit: 2024-03-17 | Discharge: 2024-03-17 | Disposition: A | Discharge status: Home / Self Care | Source: Ambulatory Visit | Attending: Family Medicine | Admitting: Family Medicine

## 2024-03-17 ENCOUNTER — Other Ambulatory Visit: Payer: Self-pay | Admitting: Family Medicine

## 2024-03-17 DIAGNOSIS — R062 Wheezing: Secondary | ICD-10-CM

## 2024-03-22 ENCOUNTER — Ambulatory Visit: Payer: BC Managed Care – PPO | Admitting: Dermatology
